# Patient Record
Sex: Male | Born: 1940 | Race: White | Hispanic: No | Marital: Married | Smoking: Former smoker
Health system: Southern US, Community
[De-identification: ages and names within clinical notes are randomized; demographics above are authoritative.]

## PROBLEM LIST (undated history)

## (undated) DIAGNOSIS — G4733 Obstructive sleep apnea (adult) (pediatric): Secondary | ICD-10-CM

## (undated) DIAGNOSIS — F32A Depression, unspecified: Secondary | ICD-10-CM

## (undated) DIAGNOSIS — Z7289 Other problems related to lifestyle: Secondary | ICD-10-CM

## (undated) DIAGNOSIS — Z87448 Personal history of other diseases of urinary system: Secondary | ICD-10-CM

## (undated) DIAGNOSIS — N2889 Other specified disorders of kidney and ureter: Secondary | ICD-10-CM

## (undated) DIAGNOSIS — I5042 Chronic combined systolic (congestive) and diastolic (congestive) heart failure: Principal | ICD-10-CM

## (undated) DIAGNOSIS — F1090 Alcohol use, unspecified, uncomplicated: Secondary | ICD-10-CM

## (undated) DIAGNOSIS — Z8639 Personal history of other endocrine, nutritional and metabolic disease: Secondary | ICD-10-CM

## (undated) DIAGNOSIS — Z9989 Dependence on other enabling machines and devices: Secondary | ICD-10-CM

## (undated) DIAGNOSIS — I4891 Unspecified atrial fibrillation: Secondary | ICD-10-CM

## (undated) DIAGNOSIS — E785 Hyperlipidemia, unspecified: Secondary | ICD-10-CM

## (undated) DIAGNOSIS — I10 Essential (primary) hypertension: Secondary | ICD-10-CM

## (undated) DIAGNOSIS — F329 Major depressive disorder, single episode, unspecified: Secondary | ICD-10-CM

## (undated) DIAGNOSIS — K8020 Calculus of gallbladder without cholecystitis without obstruction: Secondary | ICD-10-CM

## (undated) DIAGNOSIS — Z8719 Personal history of other diseases of the digestive system: Secondary | ICD-10-CM

## (undated) DIAGNOSIS — Z789 Other specified health status: Secondary | ICD-10-CM

## (undated) DIAGNOSIS — I2699 Other pulmonary embolism without acute cor pulmonale: Secondary | ICD-10-CM

## (undated) HISTORY — DX: Calculus of gallbladder without cholecystitis without obstruction: K80.20

## (undated) HISTORY — DX: Morbid (severe) obesity due to excess calories: E66.01

## (undated) HISTORY — DX: Personal history of other diseases of urinary system: Z87.448

## (undated) HISTORY — DX: Major depressive disorder, single episode, unspecified: F32.9

## (undated) HISTORY — DX: Dependence on other enabling machines and devices: Z99.89

## (undated) HISTORY — DX: Other problems related to lifestyle: Z72.89

## (undated) HISTORY — DX: Alcohol use, unspecified, uncomplicated: F10.90

## (undated) HISTORY — DX: Other pulmonary embolism without acute cor pulmonale: I26.99

## (undated) HISTORY — DX: Unspecified atrial fibrillation: I48.91

## (undated) HISTORY — DX: Essential (primary) hypertension: I10

## (undated) HISTORY — DX: Hyperlipidemia, unspecified: E78.5

## (undated) HISTORY — DX: Personal history of other diseases of the digestive system: Z87.19

## (undated) HISTORY — DX: Obstructive sleep apnea (adult) (pediatric): G47.33

## (undated) HISTORY — DX: Chronic combined systolic (congestive) and diastolic (congestive) heart failure: I50.42

## (undated) HISTORY — DX: Other specified disorders of kidney and ureter: N28.89

## (undated) HISTORY — DX: Depression, unspecified: F32.A

## (undated) HISTORY — DX: Other specified health status: Z78.9

## (undated) HISTORY — DX: Personal history of other endocrine, nutritional and metabolic disease: Z86.39

---

## 2005-06-05 ENCOUNTER — Encounter: Admission: RE | Admit: 2005-06-05 | Discharge: 2005-09-03 | Payer: Self-pay | Admitting: Endocrinology

## 2005-09-13 ENCOUNTER — Ambulatory Visit: Payer: Self-pay | Admitting: Internal Medicine

## 2005-09-14 ENCOUNTER — Ambulatory Visit: Payer: Self-pay | Admitting: Internal Medicine

## 2005-12-31 DIAGNOSIS — I2699 Other pulmonary embolism without acute cor pulmonale: Secondary | ICD-10-CM

## 2005-12-31 HISTORY — DX: Other pulmonary embolism without acute cor pulmonale: I26.99

## 2006-06-30 HISTORY — PX: ESOPHAGOGASTRODUODENOSCOPY: SHX1529

## 2006-07-01 ENCOUNTER — Encounter (INDEPENDENT_AMBULATORY_CARE_PROVIDER_SITE_OTHER): Payer: Self-pay | Admitting: Specialist

## 2006-07-01 ENCOUNTER — Ambulatory Visit (HOSPITAL_COMMUNITY): Admission: RE | Admit: 2006-07-01 | Discharge: 2006-07-01 | Payer: Self-pay | Admitting: *Deleted

## 2006-12-31 HISTORY — PX: CARDIAC CATHETERIZATION: SHX172

## 2007-01-14 ENCOUNTER — Ambulatory Visit: Payer: Self-pay | Admitting: Cardiology

## 2007-01-24 ENCOUNTER — Encounter: Payer: Self-pay | Admitting: Cardiology

## 2007-01-24 ENCOUNTER — Ambulatory Visit: Admission: RE | Admit: 2007-01-24 | Discharge: 2007-01-24 | Payer: Self-pay | Admitting: Endocrinology

## 2007-12-02 HISTORY — PX: GASTRIC BYPASS: SHX52

## 2009-02-07 ENCOUNTER — Inpatient Hospital Stay: Payer: Self-pay | Admitting: Internal Medicine

## 2009-05-31 HISTORY — PX: COLONOSCOPY: SHX174

## 2009-05-31 LAB — HM COLONOSCOPY: HM Colonoscopy: 2

## 2009-06-01 ENCOUNTER — Ambulatory Visit: Payer: Self-pay | Admitting: Unknown Physician Specialty

## 2010-08-10 ENCOUNTER — Ambulatory Visit: Payer: Self-pay | Admitting: Internal Medicine

## 2010-08-10 DIAGNOSIS — Z86718 Personal history of other venous thrombosis and embolism: Secondary | ICD-10-CM

## 2010-08-10 DIAGNOSIS — Z9884 Bariatric surgery status: Secondary | ICD-10-CM

## 2010-08-10 DIAGNOSIS — J309 Allergic rhinitis, unspecified: Secondary | ICD-10-CM | POA: Insufficient documentation

## 2010-08-10 DIAGNOSIS — G4733 Obstructive sleep apnea (adult) (pediatric): Secondary | ICD-10-CM

## 2010-08-10 DIAGNOSIS — F331 Major depressive disorder, recurrent, moderate: Secondary | ICD-10-CM | POA: Insufficient documentation

## 2010-08-15 ENCOUNTER — Ambulatory Visit: Payer: Self-pay | Admitting: Family Medicine

## 2010-08-15 LAB — CONVERTED CEMR LAB
Albumin: 4 g/dL (ref 3.5–5.2)
Alkaline Phosphatase: 60 units/L (ref 39–117)
BUN: 15 mg/dL (ref 6–23)
Calcium: 8.9 mg/dL (ref 8.4–10.5)
GFR calc non Af Amer: 130.21 mL/min (ref >60)
Glucose, Bld: 90 mg/dL (ref 70–99)
HDL: 43.3 mg/dL (ref >39.00)
LDL Cholesterol: 115 mg/dL — ABNORMAL HIGH (ref 0–99)
Potassium: 4.5 meq/L (ref 3.5–5.1)
Sodium: 140 meq/L (ref 135–145)
TSH: 1.1 microintl units/mL (ref 0.35–5.50)
Total Bilirubin: 0.8 mg/dL (ref 0.3–1.2)
VLDL: 21.6 mg/dL (ref 0.0–40.0)

## 2010-08-16 ENCOUNTER — Encounter (INDEPENDENT_AMBULATORY_CARE_PROVIDER_SITE_OTHER): Payer: Self-pay | Admitting: *Deleted

## 2010-09-13 ENCOUNTER — Ambulatory Visit: Payer: Self-pay | Admitting: Internal Medicine

## 2010-09-30 DIAGNOSIS — N179 Acute kidney failure, unspecified: Secondary | ICD-10-CM | POA: Insufficient documentation

## 2010-09-30 DIAGNOSIS — Z87448 Personal history of other diseases of urinary system: Secondary | ICD-10-CM

## 2010-09-30 HISTORY — DX: Personal history of other diseases of urinary system: Z87.448

## 2010-09-30 HISTORY — PX: DOPPLER ECHOCARDIOGRAPHY: SHX263

## 2010-10-05 ENCOUNTER — Encounter: Payer: Self-pay | Admitting: Cardiovascular Disease

## 2010-10-06 ENCOUNTER — Inpatient Hospital Stay: Payer: Self-pay | Admitting: Internal Medicine

## 2010-10-06 ENCOUNTER — Ambulatory Visit: Payer: Self-pay | Admitting: Cardiovascular Disease

## 2010-10-06 ENCOUNTER — Encounter: Payer: Self-pay | Admitting: Family Medicine

## 2010-10-07 ENCOUNTER — Encounter: Payer: Self-pay | Admitting: Family Medicine

## 2010-10-08 ENCOUNTER — Encounter: Payer: Self-pay | Admitting: Family Medicine

## 2010-10-10 ENCOUNTER — Telehealth (INDEPENDENT_AMBULATORY_CARE_PROVIDER_SITE_OTHER): Payer: Self-pay | Admitting: *Deleted

## 2010-10-12 ENCOUNTER — Ambulatory Visit: Payer: Self-pay | Admitting: Cardiovascular Disease

## 2010-10-12 DIAGNOSIS — I5042 Chronic combined systolic (congestive) and diastolic (congestive) heart failure: Secondary | ICD-10-CM | POA: Insufficient documentation

## 2010-10-16 LAB — CONVERTED CEMR LAB
Chloride: 97 meq/L (ref 96–112)
Potassium: 4.6 meq/L (ref 3.5–5.3)
Pro B Natriuretic peptide (BNP): 322.6 pg/mL — ABNORMAL HIGH (ref 0.0–100.0)
Sodium: 135 meq/L (ref 135–145)

## 2010-10-19 ENCOUNTER — Ambulatory Visit: Payer: Self-pay | Admitting: Family Medicine

## 2010-10-19 DIAGNOSIS — N179 Acute kidney failure, unspecified: Secondary | ICD-10-CM

## 2010-10-19 DIAGNOSIS — R7309 Other abnormal glucose: Secondary | ICD-10-CM

## 2010-10-20 ENCOUNTER — Telehealth (INDEPENDENT_AMBULATORY_CARE_PROVIDER_SITE_OTHER): Payer: Self-pay | Admitting: *Deleted

## 2010-10-20 LAB — CONVERTED CEMR LAB
CO2: 30 meq/L (ref 19–32)
Chloride: 101 meq/L (ref 96–112)
Glucose, Bld: 98 mg/dL (ref 70–99)
Sodium: 138 meq/L (ref 135–145)

## 2010-10-30 ENCOUNTER — Ambulatory Visit: Payer: Self-pay | Admitting: Cardiovascular Disease

## 2010-10-30 DIAGNOSIS — I4891 Unspecified atrial fibrillation: Secondary | ICD-10-CM

## 2010-10-30 HISTORY — DX: Unspecified atrial fibrillation: I48.91

## 2010-11-03 ENCOUNTER — Ambulatory Visit: Payer: Self-pay | Admitting: Internal Medicine

## 2010-11-03 ENCOUNTER — Encounter: Payer: Self-pay | Admitting: Cardiovascular Disease

## 2010-11-08 ENCOUNTER — Ambulatory Visit: Payer: Self-pay | Admitting: Cardiology

## 2010-11-08 ENCOUNTER — Encounter: Payer: Self-pay | Admitting: Cardiovascular Disease

## 2010-11-10 ENCOUNTER — Telehealth: Payer: Self-pay | Admitting: Cardiovascular Disease

## 2010-11-17 ENCOUNTER — Ambulatory Visit: Payer: Self-pay | Admitting: Cardiovascular Disease

## 2010-11-27 ENCOUNTER — Ambulatory Visit: Payer: Self-pay | Admitting: Family Medicine

## 2010-11-27 DIAGNOSIS — G4700 Insomnia, unspecified: Secondary | ICD-10-CM

## 2010-11-28 LAB — CONVERTED CEMR LAB
CO2: 33 meq/L — ABNORMAL HIGH (ref 19–32)
Chloride: 98 meq/L (ref 96–112)
GFR calc non Af Amer: 75.64 mL/min (ref >60)
Glucose, Bld: 110 mg/dL — ABNORMAL HIGH (ref 70–99)
Potassium: 3.6 meq/L (ref 3.5–5.1)
Sodium: 140 meq/L (ref 135–145)

## 2010-12-11 ENCOUNTER — Telehealth: Payer: Self-pay | Admitting: Family Medicine

## 2010-12-18 ENCOUNTER — Encounter: Payer: Self-pay | Admitting: Cardiovascular Disease

## 2010-12-18 ENCOUNTER — Ambulatory Visit: Payer: Self-pay | Admitting: Cardiovascular Disease

## 2010-12-19 ENCOUNTER — Telehealth: Payer: Self-pay | Admitting: Family Medicine

## 2010-12-20 LAB — CONVERTED CEMR LAB
CO2: 30 meq/L (ref 19–32)
Chloride: 99 meq/L (ref 96–112)
Sodium: 140 meq/L (ref 135–145)

## 2011-01-30 NOTE — Letter (Signed)
Summary: Generic Letter  [place] at [facility]  [address] [place], [place] [address]   Phone: [phone]  Fax: [phone]    08/16/2010  Jordan Mcgrath [address] [place], [place]  [address]  Dear [name],  Overall you bloodwork looks normal. Your LDL (bad cholesterol) is slightly elevated. in order to lower this pay careful attention to your diet and exercise. These two things should help to lower this number.   I have enclosed a copy of your results for your review and records. Please do not hesitate to contact the office should you have any questions or concerns.           Sincerely,      Kim Dance CMA (AAMA) for [name], MD

## 2011-01-30 NOTE — Progress Notes (Signed)
Summary: SOB  Phone Note Call from Patient Call back at Home Phone [phone]   Summary of Call: Returned call from patient who states he was discharged from the hospital recently for CHF.  He was evaluated by Dr. Mariah Milling in the hospital where he diuresed approximately 25 pounds.  He states he is more short of breath today.  He has been weighing daily and he continues to have negative numbers.  Pt states he was down 5 pounds today.  He does not notice increase swelling just mild SOB, this has not increased throughout the day.  Pt denies any orthopnea, PND, or chest pain.  Pt was calling to see if he should take his metformin as usual.  I have informed him that this was ok to continue tonight.  He was also suppose to hear from the [facility] for followup appt, this has yet to happen.  I have advised the patient to call the office in the morning to ensure follow up with our office.  I have also asked the patient to present to the ER if he has increased SOB or acute SOB.  Pt voices understanding.  He appreciated the call back.  Initial call taken by: Robbi Garter NP-PA,  October 10, 2010 11:18 PM     Appended Document: SOB pt has appt 10/13 with Dr. Mariah Milling

## 2011-01-30 NOTE — Progress Notes (Signed)
Summary: EXPECTING CALL  Phone Note Call from Patient Call back at Home Phone [phone]   Caller: SELF Call For: [facility] Summary of Call: PT WAS EXPECTING A CALL REGARDING THE EKG THAT WAS PERFORMED THE OTHER DAY-IF PT IS TO CONTINUE RX, HE WILL NEED IT CALLED IN-TORSEMIDE 20 MG TO MEDICAP Initial call taken by: Harlon Flor,  November 10, 2010 10:48 AM    Prescriptions: TORSEMIDE 20 MG TABS (TORSEMIDE) 2 tablets in am 1 in pm for heart  #90 x 1   Entered by:   Bishop Dublin, CMA   Authorized by:   Dossie Arbour MD   Signed by:   Bishop Dublin, CMA on 11/10/2010   Method used:   Electronically to        [facility] [phone]* (retail)       [address] [place], [place]  [address]       Ph: [phone]       Fax: [phone]   RxID:   [id number]

## 2011-01-30 NOTE — Assessment & Plan Note (Signed)
Summary: ekg/alt  Nurse Visit   Vital Signs:  Patient profile:   70 year old male Height:      70.5 inches Weight:      270 pounds BMI:     38.33 Pulse rate:   65 / minute BP sitting:   90 / 64  (left arm) Cuff size:   large  Vitals Entered By: Bishop Dublin, CMA (November 08, 2010 9:01 AM)  Past History:  Past Medical History: Last updated: 10/19/2010 CHF ARF (09/2010) OSA - CPAP (auto, on 7 usually) Morbid obesity s/p gastric bypass 2008 Allergic rhinitis Depression (verified on Effexor XR two times a day) h/o PE x 1 (2007) completed coumadin course  h/o GERD, DM, HTN, HLD, improved after bypass asthma as child hospitalization 09/2010 - CHF, ARF  Past Surgical History: Last updated: 10/17/2010 cath [facility] (12/2006) - normal LVEF 55%, no regional wall motion abnl.  No obstructive CAD noted.  valves WNL (Dr. 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 Lives with wife.  1 daughter, 1 son. 3 dogs - boston terrier, [place]foundland, [place] [employer] educated  Risk Factors: Alcohol Use: 2 (08/10/2010) Caffeine Use: 1-2 cup (08/10/2010)  Risk Factors: Smoking Status: quit > 6 months (08/10/2010)   Current Medications (verified): 1)  Aspirin  81 Mg Tbec (Aspirin) .... One Daily 2)  Metoprolol Tartrate 25 Mg Tabs (Metoprolol Tartrate) .... Take 1 Tablet By Mouth Two Times A Day 3)  Magnesium Oxide 400 Mg Tabs (Magnesium Oxide) .... One Daily 4)  Torsemide 20 Mg Tabs (Torsemide) .... 2 Tablets in Am 1 in Pm For Heart 5)  Nasonex 50 Mcg/act Susp (Mometasone Furoate) .... 2 Puffs Each Nostril Two Times A Day 6)  Lunesta 3 Mg Tabs (Eszopiclone) .Marland Kitchen.. 1 By Mouth At Bedtime 7)  Effexor Xr 150 Mg Xr24h-Cap (Venlafaxine Hcl) .Marland Kitchen.. 1 By Mouth Two Times A Day 8)  Fish Oil 1000 Mg Caps (Omega-3 Fatty Acids) .Marland Kitchen.. 1 By Mouth Two Times A Day 9)  Calcium 1200-1000 Mg-Unit Chew (Calcium Carbonate-Vit D-Min) .Marland Kitchen.. 1 Once Daily 10)  Mens Multivitamin Plus  Tabs (Multiple Vitamins-Minerals) .Marland Kitchen.. 1 Two Times A Day 11)  B-12 Dots 500 Mcg Subl (Cyanocobalamin) .Marland Kitchen.. 1 Every Morning Under Tongue 12)  Iron 325 (65 Fe) Mg Tabs (Ferrous Sulfate) .Marland Kitchen.. 1 Once Daily 2 Hours Before or After Calcium 13)  Qc Stool Softener 100 Mg Caps (Docusate Sodium) .... One By Mouth Two Times A Day 14)  Thiamine Hcl 100 Mg Tabs (Thiamine Hcl) .... One Daily 15)  Pradaxa 150 Mg Caps (Dabigatran Etexilate Mesylate) .... Take 1 Tablet By Mouth Two Times A Day 16)  Amiodarone Hcl 200 Mg Tabs (Amiodarone Hcl) .... Take One Tablet By Mouth Twice A Day 17)  Digoxin 0.25 Mg Tabs (  Digoxin) .... Take One Tablet By Mouth Daily  Allergies (verified): 1)  ! Sulfa  Visit Type:  Nurse visit  CC:  EKG.

## 2011-01-30 NOTE — Miscellaneous (Signed)
Summary: new pt input from psych Dr. 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) Observations: Added new observation of PAST MED HX: Morbid obesity s/p gastric bypass 2008 Allergic rhinitis Depression (verified on Effexor XR two times a day) OSA - CPAP (auto, on 7 usually) h/o PE x 1 (2007) completed coumadin course  h/o GERD, DM, HTN, HLD, improved after bypass asthma as child (08/15/2010 8:26)       Past History:  Past Medical History: Morbid obesity s/p gastric bypass 2008 Allergic rhinitis Depression (verified on Effexor XR two times a day) OSA - CPAP (auto, on 7 usually) h/o PE x 1 (2007) completed coumadin course  h/o GERD, DM, HTN, HLD, improved after bypass asthma as child  Appended Document: new pt input from PCP [place]    Clinical Lists Changes  Allergies: Changed allergy or adverse reaction from SULFA to SULFA Observations: Added new observation of PAST SURG HX: cath [facility] (12/2006) - normal LVEF 55%, no regional wall motion abnl.  No obstructive CAD noted.  valves WNL s/p gastric bypass (Roux en Y) 2008 CXR 2010 - COPD, stable CM  Colonoscopy 2 polyps, sm int hemmorhoids ([facility] [name]), rpt 5 years (05/2009) EGD (GERD) path - benign gastric mucosa (06/2006) (08/25/2010 9:28) Added new observation of ALLERGY REV: Done (08/25/2010 9:28) Added new observation of CALCIUM: 8.8 mg/dL (16/10/9603 5:40) Added new observation of ALBUMIN: 3.9 g/dL (98/10/9146 8:29) Added new observation of PROTEIN, TOT: 6.4 g/dL (56/21/3086 5:78) Added new observation of SGPT (ALT): 34 units/L (03/22/2009 9:28) Added new observation of SGOT (AST): 31 units/L (03/22/2009 9:28) Added new observation of ALK PHOS: 74 units/L (03/22/2009 9:28) Added new observation of CREATININE: 0.66 mg/dL (46/96/2952 8:41) Added new observation of BUN: 13 mg/dL (32/44/0102 7:25) Added new  observation of BG RANDOM: 103 mg/dL (36/64/4034 7:42) Added new observation of K SERUM: 4.7 meq/L (03/22/2009 9:28) Added new observation of NA: 138 meq/L (03/22/2009 9:28) Added new observation of BNP PG/ML: 117.8 pg/mL (02/24/2009 9:28) Added new observation of D_DIMER FEU: 0.3 ug/mL FEU (01/12/2009 9:28) Added new observation of PLATELETK/UL: 132 K/uL (01/12/2009 9:28) Added new observation of HCT: 42.9 % (01/12/2009 9:28) Added new observation of HGB: 14.7 g/dL (59/56/3875 6:43) Added new observation of RBC M/UL: 4.51 M/uL (01/12/2009 9:28) Added new observation of WBC COUNT: 5.1 10*3/microliter (01/12/2009 9:28) Added new observation of TSH: 1.580 microintl units/mL (01/12/2009 9:28) Added new observation of T4, TOTAL: 4.3 mcg/dL (32/95/1884 1:66) Added new observation of PSA: 0.3 ng/mL (01/12/2009 9:28) Added new observation of LDL: 76 mg/dL (06/29/1600 0:93) Added new observation of HDL: 42 mg/dL (23/55/7322 0:25) Added new observation of TRIGLYC TOT: 98 mg/dL (42/70/6237 6:28) Added new observation of CHOLESTEROL: 138 mg/dL (31/51/7616 0:73)        Allergies: 1)  ! Sulfa   Past History:  Past Surgical History: cath [facility] (12/2006) - normal LVEF 55%, no regional wall motion abnl.  No obstructive CAD noted.  valves WNL s/p gastric bypass (Roux en Y) 2008 CXR 2010 - COPD, stable CM  Colonoscopy 2 polyps, sm int hemmorhoids ([facility] [name]), rpt 5 years (05/2009) EGD (GERD) path - benign gastric mucosa (06/2006)

## 2011-01-30 NOTE — Letter (Signed)
Summary: [facility] - CHF exac, renal insufficiency  [facility]   Imported By: Lanelle Bal 10/17/2010 11:38:53  _____________________________________________________________________  External Attachment:    Type:   Image     Comment:   External Document  Appended Document: D/C summary med changes    Clinical Lists Changes  Medications: Added new medication of ASPIRIN 81 MG TBEC (ASPIRIN) one daily Added new medication of METOPROLOL TARTRATE 25 MG TABS (METOPROLOL TARTRATE) 1/2 tab two times a day Added new medication of MAGNESIUM OXIDE 400 MG TABS (MAGNESIUM OXIDE) one daily Added new medication of KLOR-CON 20 MEQ PACK (POTASSIUM CHLORIDE) two daily Changed medication from FUROSEMIDE 40 MG TABS (FUROSEMIDE) 1-2 by mouth once daily as needed (if increase in weight by 2 lbs take 2. If no increase take 1) to TORSEMIDE 20 MG TABS (TORSEMIDE) 2 tablets two times a day for heart Added new medication of THIAMINE HCL 100 MG TABS (THIAMINE HCL) one daily Observations: Added new observation of PAST MED HX: Morbid obesity s/p gastric bypass 2008 Allergic rhinitis Depression (verified on Effexor XR two times a day) OSA - CPAP (auto, on 7 usually) h/o PE x 1 (2007) completed coumadin course hospitalization 09/2010 - CHF, ARF  h/o GERD, DM, HTN, HLD, improved after bypass asthma as child (10/17/2010 23:27) Added new observation of PAST SURG HX: cath [facility] (12/2006) - normal LVEF 55%, no regional wall motion abnl.  No obstructive CAD noted.  valves WNL (Dr. 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), rpt 5 years (05/2009) EGD (GERD) path - benign gastric mucosa (06/2006) 2D Echo - dilated LV, EF 45%, diastolic dysfunction, mod-severe MR, mod pHTN (09/2010) (10/17/2010 23:27) Added new observation of FLUVAXDUE: 09/01/2011 (10/17/2010 23:27) Added new observation of  PNEUVAXDECLN: Not indicated (10/17/2010 23:27) Added new observation of DM PROGRESS: N/A (10/17/2010 23:27) Added new observation of DM FSREVIEW: N/A (10/17/2010 23:27) Added new observation of HTN PROGRESS: N/A (10/17/2010 23:27) Added new observation of HTN FSREVIEW: N/A (10/17/2010 23:27) Added new observation of LIPID PROGRS: N/A (10/17/2010 23:27) Added new observation of LIPID FSREVW: N/A (10/17/2010 23:27) Added new observation of PNEUMOVAX: given (10/08/2010 23:32) Added new observation of FLU VAX: given (10/08/2010 23:32)        Past History:  Past Medical History: Morbid obesity s/p gastric bypass 2008 Allergic rhinitis Depression (verified on Effexor XR two times a day) OSA - CPAP (auto, on 7 usually) h/o PE x 1 (2007) completed coumadin course hospitalization 09/2010 - CHF, ARF  h/o GERD, DM, HTN, HLD, improved after bypass asthma as child  Past Surgical History: cath [facility] (12/2006) - normal LVEF 55%, no regional wall motion abnl.  No obstructive CAD noted.  valves WNL (Dr. 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), rpt 5 years (05/2009) EGD (GERD) path - benign gastric mucosa (06/2006) 2D Echo - dilated LV, EF 45%, diastolic dysfunction, mod-severe MR, mod pHTN (09/2010)   -  Date:  10/08/2010    Flu vaccine given    Pneumovax given    Prevention & Chronic Care Immunizations   Influenza vaccine: given  (10/08/2010)   Influenza vaccine deferral: Refused  (09/13/2010)   Influenza vaccine due: 09/01/2011    Tetanus booster: Not documented    Pneumococcal vaccine: given  (10/08/2010)   Pneumococcal vaccine deferral: Not  indicated  (10/17/2010)    H. zoster vaccine: 09/13/2010: Zostavax   H. zoster vaccine deferral: Not indicated  (09/13/2010)  Colorectal Screening   Hemoccult: Not documented   Hemoccult action/deferral: Not indicated   (09/13/2010)    Colonoscopy: Not documented  Other Screening   PSA: 0.75  (08/15/2010)   PSA due due: 08/16/2011   Smoking status: quit > 6 months  (08/10/2010)  Lipids   Total Cholesterol: 180  (08/15/2010)   LDL: 115  (08/15/2010)   LDL Direct: Not documented   HDL: 43.30  (08/15/2010)   Triglycerides: 108.0  (08/15/2010)

## 2011-01-30 NOTE — Assessment & Plan Note (Signed)
Summary: 1 MONTH FOLLOW UP/[facility]   Vital Signs:  Patient profile:   70 year old male Weight:      294.50 pounds Temp:     98.0 degrees F oral Pulse rate:   88 / minute Pulse rhythm:   regular BP sitting:   110 / 70  (left arm) Cuff size:   large  Vitals Entered By: Selena Batten Dance CMA (AAMA) (September 13, 2010 8:00 AM) CC: 1 month follow up   History of Present Illness: CC: 93mo f/u  Weight - stable.  walking daily 2-4 miles/day with dogs.  No SOB, CP with walking.  staying busy at home - renovated room.  s/p gastric bypass.  Received records from Dr. Chauncey Mann surgery, reviewed.  Blood work - reviewed.  Requests zostavax - states insurance will cover.  Still waiting records from Dr. Juleen China, previous PCP, regarding tetanus and pneumovax.  Will receive flu at [facility].  Adjusting to retired life.  Current Medications (verified): 1)  Nasonex 50 Mcg/act Susp (Mometasone Furoate) .... 2 Puffs Each Nostril Two Times A Day 2)  Furosemide 40 Mg Tabs (Furosemide) .Marland Kitchen.. 1-2 By Mouth Once Daily As Needed (If Increase in Weight By 2 Lbs Take 2. If No Increase Take 1) 3)  Lunesta 3 Mg Tabs (Eszopiclone) .Marland Kitchen.. 1 By Mouth At Bedtime 4)  Effexor Xr 150 Mg Xr24h-Cap (Venlafaxine Hcl) .Marland Kitchen.. 1 By Mouth Two Times A Day 5)  Fish Oil 1000 Mg Caps (Omega-3 Fatty Acids) .Marland Kitchen.. 1 By Mouth Two Times A Day 6)  Calcium 1200-1000 Mg-Unit Chew (Calcium Carbonate-Vit D-Min) .Marland Kitchen.. 1 Once Daily 7)  Mens Multivitamin Plus  Tabs (Multiple Vitamins-Minerals) .Marland Kitchen.. 1 Two Times A Day 8)  B-12 Dots 500 Mcg Subl (Cyanocobalamin) .Marland Kitchen.. 1 Every Morning Under Tongue 9)  Iron 325 (65 Fe) Mg Tabs (Ferrous Sulfate) .Marland Kitchen.. 1 Once Daily 2 Hours Before or After Calcium 10)  Qc Stool Softener 100 Mg Caps (Docusate Sodium) .... One By Mouth Two Times A Day  Allergies: 1)  ! 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 Lives with wife.  1 daughter, 1 son. 3 dogs - boston terrier, [place], [employer] educated  Review of Systems       per HPI  Physical Exam  General:  Well-developed,well-nourished,in no acute distress; alert,appropriate and cooperative throughout examination.  obese Lungs:  Normal respiratory effort, chest expands symmetrically. Lungs are clear to auscultation, no crackles or wheezes. Heart:  Normal rate and regular rhythm. S1 and S2 normal without gallop, murmur, click, rub or other extra sounds. tachycardic Pulses:  2+ rad pulses Extremities:  No clubbing, cyanosis, edema, or deformity noted with normal full range of motion of all joints.     Impression & Recommendations:  Problem # 1:  MORBID OBESITY (ICD-278.01)  f/u 6 mo, sooner if needed.  weight stable.  encouraged continued exercise, staying active, watching diet.  Ht: 70.5 (08/10/2010)   Wt: 294.50 (09/13/2010)   BMI: 41.95 (08/10/2010)  Problem # 2:  BARIATRIC SURGERY STATUS (ICD-V45.86) continue to monitor.  will input records from [facility].  Problem # 3:  Preventive Health Care (ICD-V70.0) still awaiting records from Dr. Juleen China re last pneumovax and tetanus.  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  Complete Medication  List: 1)  Nasonex 50 Mcg/act Susp (Mometasone furoate) .... 2 puffs each nostril two times a day 2)  Furosemide 40 Mg Tabs (Furosemide) .Marland Kitchen.. 1-2 by mouth once daily as needed (if increase in weight by 2 lbs take 2. if no increase take 1) 3)  Lunesta 3 Mg Tabs (Eszopiclone) .Marland Kitchen.. 1 by mouth at bedtime 4)  Effexor Xr 150 Mg Xr24h-cap (Venlafaxine hcl) .Marland Kitchen.. 1 by mouth two times a day 5)  Fish Oil 1000 Mg Caps (Omega-3 fatty acids) .Marland Kitchen.. 1 by mouth two times a day 6)  Calcium 1200-1000 Mg-unit Chew (Calcium carbonate-vit d-min) .Marland Kitchen.. 1 once daily 7)  Mens  Multivitamin Plus Tabs (Multiple vitamins-minerals) .Marland Kitchen.. 1 two times a day 8)  B-12 Dots 500 Mcg Subl (Cyanocobalamin) .Marland Kitchen.. 1 every morning under tongue 9)  Iron 325 (65 Fe) Mg Tabs (Ferrous sulfate) .Marland Kitchen.. 1 once daily 2 hours before or after calcium 10)  Qc Stool Softener 100 Mg Caps (Docusate sodium) .... One by mouth two times a day  Other Orders: Zoster (Shingles) Vaccine Live [phone]) Admin 1st Vaccine ([id number])  Patient Instructions: 1)  Please return in 6 months or as needed. 2)  Zostavax today. 3)  Pleasure to see you today.  Call clinic with questions.  Current Allergies (reviewed today): ! SULFA   Prevention & Chronic Care Immunizations   Influenza vaccine: Not documented   Influenza vaccine deferral: Refused  (09/13/2010)    Tetanus booster: Not documented    Pneumococcal vaccine: Not documented    H. zoster vaccine: 09/13/2010: Zostavax   H. zoster vaccine deferral: Not indicated  (09/13/2010)  Colorectal Screening   Hemoccult: Not documented   Hemoccult action/deferral: Not indicated  (09/13/2010)    Colonoscopy: Not documented  Other Screening   PSA: 0.75  (08/15/2010)   PSA due due: 08/16/2011   Smoking status: quit > 6 months  (08/10/2010)  Lipids   Total Cholesterol: 180  (08/15/2010)   LDL: 115  (08/15/2010)   LDL Direct: Not documented   HDL: 43.30  (08/15/2010)   Triglycerides: 108.0  (08/15/2010)    Immunizations Administered:  Zostavax # 1:    Vaccine Type: Zostavax    Site: right deltoid    Mfr: Merck    Dose: 0.65    Route: [place]    Given by: Selena Batten Dance CMA (AAMA)    Exp. Date: 07/26/2011    Lot #: [id number]    VIS given: 10/12/05 given September 13, 2010.  Appended Document: record input [facility] Dr. Lily Peer received records and input into flowsheet. [name]  MD  September 14, 2010 12:33 PM    Clinical Lists Changes  Observations: Added new observation of PAST SURG HX: cath [facility] (12/2006) - normal LVEF 55%, no regional wall  motion abnl.  No obstructive CAD noted.  valves WNL (Dr. 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), rpt 5 years (05/2009) EGD (GERD) path - benign gastric mucosa (06/2006) (09/14/2010 12:24) Added new observation of CALCIUM: 9.4 mg/dL (56/21/3086 57:84) Added new observation of ALBUMIN: 3.8 g/dL (69/62/9528 41:32) Added new observation of PROTEIN, TOT: 6.3 g/dL (44/12/270 53:66) Added new observation of SGPT (ALT): 41 units/L (06/09/2010 12:24) Added new observation of SGOT (AST): 37 units/L (06/09/2010 12:24) Added new observation of ALK PHOS: 69 units/L (06/09/2010 12:24) Added new observation of BILI TOTAL: 0.7 mg/dL (44/02/4741 59:56) Added new observation of CREATININE: 0.8 mg/dL (38/75/6433 29:51)  Added new observation of BUN: 9 mg/dL (13/07/6577 46:96) Added new observation of BG RANDOM: 97 mg/dL (29/52/8413 24:40) Added new observation of ANION GAP: 5  (06/09/2010 12:24) Added new observation of CO2 PLSM/SER: 29 meq/L (06/09/2010 12:24) Added new observation of CL SERUM: 106 meq/L (06/09/2010 12:24) Added new observation of K SERUM: 4.5 meq/L (06/09/2010 12:24) Added new observation of NA: 140 meq/L (06/09/2010 12:24) Added new observation of PLATELETK/UL: 136 K/uL (05/20/2009 12:24) Added new observation of MCV: 96.4 fL (05/20/2009 12:24) Added new observation of HCT: 43.9 % (05/20/2009 12:24) Added new observation of HGB: 15.4 g/dL (10/27/2535 64:40) Added new observation of RBC M/UL: 4.56 M/uL (05/20/2009 12:24) Added new observation of WBC COUNT: 4.9 10*3/microliter (05/20/2009 12:24) Added new observation of HGBA1C: 5.1 % (05/20/2009 12:24) Added new observation of FERRITIN: 77 ng/mL (05/20/2009 12:24) Added new observation of IRON SATUR %: 32 % (05/20/2009 12:24) Added new observation of TIBC: 376 mcg/dL (34/74/2595 63:87) Added new observation of IRON: 122 mcg/dL  (56/43/3295 18:84) Added new observation of VIT D 25-OH: 33.8 ng/mL (05/20/2009 12:24) Added new observation of FOLATE: >20.0 ng/mL (05/20/2009 12:24) Added new observation of TSH: 1.009 microintl units/mL (05/20/2009 12:24) Added new observation of PTH, INTACT: 66 pg/mL (05/20/2009 12:24) Added new observation of B12: 917 pg/mL (11/18/2008 12:24) Added new observation of LDL: 91 mg/dL (16/60/6301 60:10) Added new observation of HDL: 31 mg/dL (93/23/5573 22:02) Added new observation of TRIGLYC TOT: 74 mg/dL (54/27/0623 76:28) Added new observation of CHOLESTEROL: 137 mg/dL (31/51/7616 07:37) Added new observation of URIC ACID: 4.9 mg/dL (10/62/6948 54:62) Vitamin B1: 126 NMOL/L  (05/20/2009)         Past History:  Past Surgical History: cath [facility] (12/2006) - normal LVEF 55%, no regional wall motion abnl.  No obstructive CAD noted.  valves WNL (Dr. 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), rpt 5 years (05/2009) EGD (GERD) path - benign gastric mucosa (06/2006)

## 2011-01-30 NOTE — Progress Notes (Signed)
----   Converted from flag ---- ---- 10/20/2010 10:09 AM, [name]  MD wrote: dx 428.0  ---- 10/20/2010 8:55 AM, Mills Koller wrote: I can add a MG, What DX? T  ---- 10/19/2010 6:13 PM, [name]  MD wrote:  can we add Mg and A1c to blood drawn yesterday? ------------------------------

## 2011-01-30 NOTE — Assessment & Plan Note (Signed)
Summary: ROA FOR FOLLOW-UP/JRR   Vital Signs:  Patient profile:   70 year old male Weight:      270.50 pounds Temp:     97.9 degrees F oral Pulse rate:   72 / minute Pulse rhythm:   regular BP sitting:   136 / 78  (left arm) Cuff size:   large  Vitals Entered By: Selena Batten Dance CMA Duncan Dull) (November 27, 2010 8:18 AM) CC: Follow up   History of Present Illness: CC: f/u issues  69yo with h/o gastric bypass 2008, CHF, HTN, diet controlled DM, and OSA presenting for f/u.  1. insomnia - lunesta works well for sleep but after taste driving him crazy.  Has been on lunesta x 6 mo, using nightly, if abstains, trouble falling asleep and staying asleep.  hasn't tried anything else prior (no ambien, tylenol PM, unisom, etc).  Goes to bed prior to 10pm.  No TV at bedtime.  seems to have good sleep hygiene.  2. CHF/afib - doing well on amiodarone. Started on Pradaxa.  Initially had nosebleed (not uncommon for him).  No SOB, CP/tightness, palpitations, dizziness.  Checks BP and HR 3x/day.  Checks weight qam, stable at 268.  3. depression - last saw Dr. Nolen Mu 6 mo ago.  was to see her earlier this month but had scheduling conflict.  feels mood is well controlled in current effexor XR two times a day.  4. HTN - actually bp up a bit for him, states normally sbp runs in 110s.  Current Medications (verified): 1)  Aspirin 81 Mg Tbec (Aspirin) .... One Daily 2)  Metoprolol Tartrate 25 Mg Tabs (Metoprolol Tartrate) .... Take 1 Tablet By Mouth Two Times A Day 3)  Torsemide 20 Mg Tabs (Torsemide) .... 2 Tablets in Am 1 in Pm For Heart 4)  Nasonex 50 Mcg/act Susp (Mometasone Furoate) .... 2 Puffs Each Nostril Two Times A Day 5)  Ambien 5 Mg Tabs (Zolpidem Tartrate) .... Take One Nightly For Sleep 6)  Effexor Xr 150 Mg Xr24h-Cap (Venlafaxine Hcl) .Marland Kitchen.. 1 By Mouth Two Times A Day 7)  Fish Oil 1000 Mg Caps (Omega-3 Fatty Acids) .Marland Kitchen.. 1 By Mouth Two Times A Day 8)  Calcium 1200-1000 Mg-Unit Chew (Calcium  Carbonate-Vit D-Min) .Marland Kitchen.. 1 Once Daily 9)  Mens Multivitamin Plus  Tabs (Multiple Vitamins-Minerals) .Marland Kitchen.. 1 Two Times A Day 10)  B-12 Dots 500 Mcg Subl (Cyanocobalamin) .Marland Kitchen.. 1 Every Morning Under Tongue 11)  Iron 325 (65 Fe) Mg Tabs (Ferrous Sulfate) .Marland Kitchen.. 1 Once Daily 2 Hours Before or After Calcium 12)  Qc Stool Softener 100 Mg Caps (Docusate Sodium) .... One By Mouth Two Times A Day 13)  Pradaxa 150 Mg Caps (Dabigatran Etexilate Mesylate) .... Take 1 Tablet By Mouth Two Times A Day 14)  Amiodarone Hcl 200 Mg Tabs (Amiodarone Hcl) .... Take One Tablet Once Daily 15)  Digoxin 0.25 Mg Tabs (Digoxin) .... Take One Tablet By Mouth Daily  Allergies: 1)  ! Sulfa  Past History:  Past Surgical History: Last updated: 10/17/2010 cath [facility] (12/2006) - normal LVEF 55%, no regional wall motion abnl.  No obstructive CAD noted.  valves WNL (Dr. 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 Lives with wife.  1 daughter, 1 son. 3 dogs - boston terrier, newfoundland, Government social research officer educated  Past Medical History: CHF systolic (EF 45%) and diastolic, mod-severe TR ARF (09/2010) OSA - CPAP (auto, on 7 usually) Morbid obesity s/p gastric bypass 2008 Allergic rhinitis Depression (verified on Effexor XR two times a day) h/o PE x 1 (2007) completed coumadin course  h/o GERD, DM, HTN, HLD, improved after bypass asthma as child hospitalization 09/2010 - CHF, ARF  Review of Systems        per HPI  Physical Exam  General:  Well-developed,well-nourished,in no acute distress; alert,appropriate and cooperative throughout examination.  obese. Head:  Normocephalic and atraumatic without obvious abnormalities. No apparent alopecia or balding. Eyes:  PERRLA, EOMI, + conjunctival injection Mouth:  Oral mucosa and oropharynx without lesions or exudates.  Teeth in good repair.  + cobblestoning pharynx and PND Neck:  No deformities, masses, or tenderness noted.  no bruits Lungs:  Normal respiratory effort, chest expands symmetrically. Lungs are clear to auscultation, no crackles or wheezes. Heart:  regular, normal S1, S2.  no m/r/g Pulses:  2+ rad pulses Extremities:  no pedal edema Skin:  Intact without suspicious lesions or rashes Psych:  blunted affect.  somewhat overwhelmed with recent health issues.   Impression & Recommendations:  Problem # 1:  CHF (ICD-428.0) has been on torsemide >63mo now.  check Cr to ensure tolerating.  discussed low salt diet which patient follows.  discussed daily weight checking, to call us if SOB, CP, leg swelling, fatigue, or noting weight going up.  continue current meds.  continue metoprolol 25 BID  His updated medication list for this problem includes:    Aspirin 81 Mg Tbec (Aspirin) ..... One daily    Metoprolol Tartrate 25 Mg Tabs (Metoprolol tartrate) .Marland Kitchen... Take 1 tablet by mouth two times a day    Torsemide 20 Mg Tabs (Torsemide) .Marland Kitchen... 2 tablets in am 1 in pm for heart    Digoxin 0.25 Mg Tabs (Digoxin) .Marland Kitchen... Take one tablet by mouth daily  Orders: TLB-BMP (Basic Metabolic Panel-BMET) (80048-METABOL)  Problem # 2:  ATRIAL FIBRILLATION (ICD-427.31) NSR today on amiodarone, digoxin, metoprolol.  on pradaxa.  CHADS2 score = 2, 3 if include h/o DM.  also on ASA  His updated medication list for this problem includes:    Aspirin 81 Mg Tbec (Aspirin) ..... One daily    Metoprolol Tartrate 25 Mg Tabs (Metoprolol tartrate) .Marland Kitchen... Take 1 tablet by  mouth two times a day    Amiodarone Hcl 200 Mg Tabs (Amiodarone hcl) .Marland Kitchen... Take one tablet once daily    Digoxin 0.25 Mg Tabs (Digoxin) .Marland Kitchen... Take one tablet by mouth daily  Problem # 3:  HYPERGLYCEMIA (ICD-790.29)  check A1c next blood draw.  Labs Reviewed: Creat: 1.1 (10/19/2010)     Problem # 4:  RENAL FAILURE, ACUTE (ICD-584.9)  resolved.  Cr 1.0 on torsemide.  continue.  K 3.6.    Labs Reviewed: BUN: 26 (10/19/2010)   Cr: 1.1 (10/19/2010)    Hgb: 15.4 (05/20/2009)   Hct: 43.9 (05/20/2009)   Ca++: 9.7 (10/19/2010)    TP: 6.4 (08/15/2010)   Alb: 4.0 (08/15/2010)  Problem # 5:  DEPRESSION, MAJOR, RECURRENT, MODERATE (ICD-296.32) cotninue effexor, await f/u with psych.   Problem # 6:  INSOMNIA (  ICD-780.52) did not tolerate lunesta 2/2 aftertaste.  trial of ambien.  Discussed sleep hygiene.   His updated medication list for this problem includes:    Ambien 5 Mg Tabs (Zolpidem tartrate) .Marland Kitchen... Take one nightly for sleep  Complete Medication List: 1)  Aspirin 81 Mg Tbec (Aspirin) .... One daily 2)  Metoprolol Tartrate 25 Mg Tabs (Metoprolol tartrate) .... Take 1 tablet by mouth two times a day 3)  Torsemide 20 Mg Tabs (Torsemide) .... 2 tablets in am 1 in pm for heart 4)  Nasonex 50 Mcg/act Susp (Mometasone furoate) .... 2 puffs each nostril two times a day 5)  Ambien 5 Mg Tabs (Zolpidem tartrate) .... Take one nightly for sleep 6)  Effexor Xr 150 Mg Xr24h-cap (Venlafaxine hcl) .Marland Kitchen.. 1 by mouth two times a day 7)  Fish Oil 1000 Mg Caps (Omega-3 fatty acids) .Marland Kitchen.. 1 by mouth two times a day 8)  Calcium 1200-1000 Mg-unit Chew (Calcium carbonate-vit d-min) .Marland Kitchen.. 1 once daily 9)  Mens Multivitamin Plus Tabs (Multiple vitamins-minerals) .Marland Kitchen.. 1 two times a day 10)  B-12 Dots 500 Mcg Subl (Cyanocobalamin) .Marland Kitchen.. 1 every morning under tongue 11)  Iron 325 (65 Fe) Mg Tabs (Ferrous sulfate) .Marland Kitchen.. 1 once daily 2 hours before or after calcium 12)  Qc Stool Softener 100 Mg Caps (Docusate sodium)  .... One by mouth two times a day 13)  Pradaxa 150 Mg Caps (Dabigatran etexilate mesylate) .... Take 1 tablet by mouth two times a day 14)  Amiodarone Hcl 200 Mg Tabs (Amiodarone hcl) .... Take one tablet once daily 15)  Digoxin 0.25 Mg Tabs (Digoxin) .... Take one tablet by mouth daily  Patient Instructions: 1)  Return in 3 months for follow up. 2)  Return in next few months for complete physical. 3)  Trial of ambien at night for sleep.   4)  BMP today (blood work) to check kidneys. 5)  Call clinic with questions.  Good to see you today. Prescriptions: AMBIEN 5 MG TABS (ZOLPIDEM TARTRATE) take one nightly for sleep  #30 x 1   Entered and Authorized by:   [name]  MD   Signed by:   [name]  MD on 11/27/2010   Method used:   Print then Give to Patient   RxID:   [id number]    Orders Added: 1)  TLB-BMP (Basic Metabolic Panel-BMET) [80048-METABOL] 2)  Est. Patient Level IV [14782]    Current Allergies (reviewed today): ! SULFA

## 2011-01-30 NOTE — Letter (Signed)
Summary: [facility]  [facility]   Imported By: Beau Fanny 10/19/2010 13:19:50  _____________________________________________________________________  External Attachment:    Type:   Image     Comment:   External Document

## 2011-01-30 NOTE — Assessment & Plan Note (Signed)
Summary: F2W/AMD   Visit Type:  Follow-up Primary Provider:  [name]  MD  CC:  Denies irregular heartbeat & chest pain. Feeling better.Marland Kitchen  History of Present Illness: [name] is a 70 no gentleman, with a hx of  gastric bypass in 2008, CHF, renal insufficiency, obstructive sleep apnea, diabetes that is diet controlled, hypertension. echocardiogram showed moderate pulmonary hypertension, moderate to severe mitral regurgitation, ejection fraction 45%. On telemetry, he had runs of SVT versus short runs of atrial fibrillation that had resolved. History of PE in 2006. He had aggressive diuresis.  he presented to the office at the end of October with atrial fibrillation and RVR with rate of 141 beats per minute. He was started on amiodarone loading and over the next week or so, converted to normal sinus rhythm as confirmed by EKG on November 9.  Since that time he has felt well. We have weaned his amiodarone down to 200 mg daily and he has no complaints. No lightheadedness, no dizziness, no tachycardia.   echocardiogram from October 7 of this year shows ejection fraction 45%, mildly dilated LV, diastolic dysfunction, mildly dilated RV, moderate to severe MR, moderately elevated right ventricular systolic pressures estimated at 50 mm of mercury.  he reports having a cardiac catheterization prior to his gastric bypass in 2008 showed no significant coronary artery disease. This was [facility]  EKG showsnormal sinus rhythm with rate 56 beats per minute, intraventricular conduction delay, left axis deviation/left anterior fascicular block  Current Medications (verified): 1)  Aspirin 81 Mg Tbec (Aspirin) .... One Daily 2)  Metoprolol Tartrate 25 Mg Tabs (Metoprolol Tartrate) .... Take 1 Tablet By Mouth Two Times A Day 3)  Magnesium Oxide 400 Mg Tabs (Magnesium Oxide) .... One Daily 4)  Torsemide 20 Mg Tabs (Torsemide) .... 2 Tablets in Am 1 in Pm For Heart 5)  Nasonex  50 Mcg/act Susp (Mometasone Furoate) .... 2 Puffs Each Nostril Two Times A Day 6)  Lunesta 3 Mg Tabs (Eszopiclone) .Marland Kitchen.. 1 By Mouth At Bedtime 7)  Effexor Xr 150 Mg Xr24h-Cap (Venlafaxine Hcl) .Marland Kitchen.. 1 By Mouth Two Times A Day 8)  Fish Oil 1000 Mg Caps (Omega-3 Fatty Acids) .Marland Kitchen.. 1 By Mouth Two Times A Day 9)  Calcium 1200-1000 Mg-Unit Chew (Calcium Carbonate-Vit D-Min) .Marland Kitchen.. 1 Once Daily 10)  Mens Multivitamin Plus  Tabs (Multiple Vitamins-Minerals) .Marland Kitchen.. 1 Two Times A Day 11)  B-12 Dots 500 Mcg Subl (Cyanocobalamin) .Marland Kitchen.. 1 Every Morning Under Tongue 12)  Iron 325 (65 Fe) Mg Tabs (Ferrous Sulfate) .Marland Kitchen.. 1 Once Daily 2 Hours Before or After Calcium 13)  Qc Stool Softener 100 Mg Caps (Docusate Sodium) .... One By Mouth Two Times A Day 14)  Thiamine Hcl 100 Mg Tabs (Thiamine Hcl) .... One Daily 15)  Pradaxa 150 Mg Caps (Dabigatran Etexilate Mesylate) .... Take 1 Tablet By Mouth Two Times A Day 16)  Amiodarone Hcl 200 Mg Tabs (Amiodarone Hcl) .... Take One Tablet Once Daily 17)  Digoxin 0.25 Mg Tabs (Digoxin) .... Take One Tablet By Mouth Daily  Allergies (verified): 1)  ! Sulfa  Past History:  Past Medical History: Last updated: 10/19/2010 CHF ARF (09/2010) OSA - CPAP (auto, on 7 usually) Morbid obesity s/p gastric bypass 2008 Allergic rhinitis Depression (verified on Effexor XR two times a day) h/o PE x 1 (2007) completed coumadin course  h/o GERD, DM, HTN, HLD, improved after bypass asthma as child hospitalization 09/2010 - CHF, ARF  Past Surgical History:  Last updated: 10/17/2010 cath [facility] (12/2006) - normal LVEF 55%, no regional wall motion abnl.  No obstructive CAD noted.  valves WNL (Dr. 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 Lives with wife.  1 daughter, 1 son. 3 dogs - boston terrier, newfoundland, [place] [employer] educated  Risk Factors: Alcohol Use: 2 (08/10/2010) Caffeine Use: 1-2 cup (08/10/2010)  Risk Factors: Smoking Status: quit > 6 months (08/10/2010)  Review of Systems  The patient denies fever, weight loss, weight gain, vision loss, decreased hearing, hoarseness, chest pain, syncope, dyspnea on exertion, peripheral edema, prolonged cough, abdominal pain, incontinence, muscle weakness, depression, and enlarged lymph nodes.    Vital Signs:  Patient profile:   70 year old male Height:      70.5 inches Weight:      269.50 pounds BMI:     38.26 BP sitting:   96 / 70  (left arm) Cuff size:   large  Vitals Entered By: Adrian Prince 70 (November 17, 2010 2:19 PM)  Physical Exam  General:  Well developed, well nourished, in no acute distress. Head:  normocephalic and atraumatic Neck:  Neck supple, no JVD. No masses, thyromegaly or abnormal cervical nodes. Lungs:  Clear bilaterally to auscultation and percussion. Heart:  Non-displaced PMI, chest non-tender; regular rate and rhythm, with II/VI SEM LSB, no rubs or gallops. Carotid upstroke normal, no bruit. Normal abdominal aortic size, no bruits. Femorals normal pulses, no bruits. Pedals normal pulses. Trace LE edema, no varicosities. Abdomen:  Bowel sounds positive; abdomen soft and non-tender without masses, obese Msk:  Back normal, normal gait. Muscle  strength and tone normal. Pulses:  pulses normal in all 4 extremities Extremities:  No clubbing or cyanosis. Neurologic:  Alert and oriented x 3. Skin:  Intact without lesions or rashes. Psych:  Normal affect.   Impression & Recommendations:  Problem # 1:  ATRIAL FIBRILLATION (ICD-427.31) pharmacologically converted to normal sinus rhythm on amiodarone, now maintaining normal sinus rhythm on low dose amiodarone. We'll continue his metoprolol, digoxin. His heart rate is borderline low and we have suggested that if he is symptomatic, he could cut his metoprolol in half to 12.5 mg b.i.d..  His updated medication list for this problem includes:    Aspirin 81 Mg Tbec (Aspirin) ..... One daily    Metoprolol Tartrate 25 Mg Tabs (Metoprolol tartrate) .Marland Kitchen... Take 1 tablet by mouth two times a day    Amiodarone Hcl 200 Mg Tabs (Amiodarone hcl) .Marland Kitchen... Take one tablet once daily    Digoxin 0.25 Mg Tabs (Digoxin) .Marland Kitchen... Take one tablet by mouth daily  Problem # 2:  CHF (ICD-428.0) He does have significant mitral regurgitation and moderate pulmonary hypertension that has improved on torsemide 2 tabs in the morning and one tab at night.  We'll  check a basic metabolic panel in one month's time.  His updated medication list for this problem includes:    Aspirin 81 Mg Tbec (Aspirin) ..... One daily    Metoprolol Tartrate 25 Mg Tabs (Metoprolol tartrate) .Marland Kitchen... Take 1 tablet by mouth two times a day    Torsemide 20 Mg Tabs (Torsemide) .Marland Kitchen... 2 tablets in am 1 in pm for heart    Amiodarone Hcl 200 Mg Tabs (Amiodarone hcl) .Marland Kitchen... Take one tablet once daily    Digoxin 0.25 Mg Tabs (Digoxin) .Marland Kitchen... Take one tablet by mouth daily  Problem # 3:  OBSTRUCTIVE SLEEP APNEA (ICD-327.23) He has underlying obstructive sleep apnea and does wear CPAP  Problem # 4:  MORBID OBESITY (ICD-278.01) he has a history of morbid obesity and has a history of recent gastric bypass in 2008 with significant weight loss  Patient  Instructions: 1)  Your physician recommends that you schedule a follow-up appointment in: 6 months 2)  Your physician recommends that you continue on your current medications as directed. Please refer to the Current Medication list given to you today.

## 2011-01-30 NOTE — Assessment & Plan Note (Signed)
[facility]   Visit Type:  Initial Consult Primary Provider:  [name]  MD  CC:  c/o shortness of breath with nausea.  He gets dizzy when sitting for a period of time and has a pounding in chest..  History of Present Illness: Jordan Mcgrath is a 57 no gentleman, known to me from a consult done at [facility] who presents today for followup, new visit to the office. He was seen in the hospital for new CHF, renal insufficiency, obstructive sleep apnea, diabetes that is diet controlled, hypertension. echocardiogram showed moderate pulmonary hypertension, moderate to severe mitral regurgitation, ejection fraction 45%. On telemetry, he had runs of SVT versus short runs of atrial fibrillation that had resolved. History of PE in 2006. He had aggressive diuresis and then was discharged home with followup today.  Today he reports that he's been more short of breath over the past several days. Particularly over this weekend, he had worsening shortness of breath. He reports some tachycardia palpitations possibly on Sunday, into Saturday. These tachypalpitations or new and he has shortness of breath with exertion. He denies any significant weight gain recently and in fact believes that he may have lost some weight. He has been taking his diuretic daily. He believes that the potassium supplement pill is upsetting his stomach.  echocardiogram from October 7 of this year shows ejection fraction 45%, mildly dilated LV, diastolic dysfunction, mildly dilated RV, moderate to severe MR, moderately elevated right ventricular systolic pressures estimated at 50 mm of mercury.  EKG shows atrial fibrillation with RVR, ventricular rate of 141 beats per minute, poor R-wave progression through the precordial leads, left axis deviation  Current Medications (verified): 1)  Aspirin 81 Mg Tbec (Aspirin) .... One Daily 2)  Metoprolol Tartrate 25 Mg Tabs (Metoprolol Tartrate) .... 1/2 Tab Two Times A Day 3)  Magnesium  Oxide 400 Mg Tabs (Magnesium Oxide) .... One Daily 4)  Klor-Con 20 Meq Pack (Potassium Chloride) .... Two Daily 5)  Torsemide 20 Mg Tabs (Torsemide) .... 2 Tablets in Am 1 in Pm For Heart 6)  Nasonex 50 Mcg/act Susp (Mometasone Furoate) .... 2 Puffs Each Nostril Two Times A Day 7)  Lunesta 3 Mg Tabs (Eszopiclone) .Marland Kitchen.. 1 By Mouth At Bedtime 8)  Effexor Xr 150 Mg Xr24h-Cap (Venlafaxine Hcl) .Marland Kitchen.. 1 By Mouth Two Times A Day 9)  Fish Oil 1000 Mg Caps (Omega-3 Fatty Acids) .Marland Kitchen.. 1 By Mouth Two Times A Day 10)  Calcium 1200-1000 Mg-Unit Chew (Calcium Carbonate-Vit D-Min) .Marland Kitchen.. 1 Once Daily 11)  Mens Multivitamin Plus  Tabs (Multiple Vitamins-Minerals) .Marland Kitchen.. 1 Two Times A Day 12)  B-12 Dots 500 Mcg Subl (Cyanocobalamin) .Marland Kitchen.. 1 Every Morning Under Tongue 13)  Iron 325 (65 Fe) Mg Tabs (Ferrous Sulfate) .Marland Kitchen.. 1 Once Daily 2 Hours Before or After Calcium 14)  Qc Stool Softener 100 Mg Caps (Docusate Sodium) .... One By Mouth Two Times A Day 15)  Thiamine Hcl 100 Mg Tabs (Thiamine Hcl) .... One Daily  Allergies (verified): 1)  ! Sulfa  Past History:  Past Medical History: Last updated: 10/19/2010 CHF ARF (09/2010) OSA - CPAP (auto, on 7 usually) Morbid obesity s/p gastric bypass 2008 Allergic rhinitis Depression (verified on Effexor XR two times a day) h/o PE x 1 (2007) completed coumadin course  h/o GERD, DM, HTN, HLD, improved after bypass asthma as child hospitalization 09/2010 - CHF, ARF  Past Surgical History: Last updated: 10/17/2010 cath [facility] (12/2006) - normal LVEF 55%, no regional wall motion  abnl.  No obstructive CAD noted.  valves WNL (Dr. 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 Lives with wife.  1 daughter, 1 son. 3 dogs - boston terrier, newfoundland, pekingese College educated  Risk Factors: Alcohol Use: 2 (08/10/2010) Caffeine Use: 1-2 cup (08/10/2010)  Risk Factors: Smoking Status: quit > 6 months (08/10/2010)  Review of Systems       The patient complains of dyspnea on exertion and peripheral edema.  The patient denies fever, weight loss, weight gain, vision loss, decreased hearing, hoarseness, chest pain, syncope, prolonged cough, abdominal pain, incontinence, muscle weakness, depression, and enlarged lymph nodes.         tachypalpitations  Vital Signs:  Patient profile:   70 year old male Height:      70.5 inches Weight:      271 pounds BMI:     38.47 Pulse rate:   145 / minute BP sitting:   80 / 60  (left arm) Cuff size:   large  Vitals Entered By: Bishop Dublin, CMA (October 30, 2010 3:33 PM)  Physical Exam  General:  Well developed, well nourished, in no acute distress. Head:  normocephalic and atraumatic Neck:  Neck supple, no JVD. No masses, thyromegaly or abnormal cervical nodes. Lungs:  Clear bilaterally to auscultation and percussion. Heart:  Non-displaced PMI, chest non-tender; tachycardia,  irregular rate and rhythm, with II/VI SEM LSB, no rubs or gallops. Carotid upstroke normal, no bruit. Normal abdominal aortic size, no bruits. Femorals normal pulses, no bruits. Pedals normal pulses. Trace LE edema, no varicosities. Abdomen:  Bowel sounds positive; abdomen soft and non-tender without  masses, obese Msk:  Back normal, normal gait. Muscle strength and tone normal. Pulses:  pulses normal in all 4 extremities Extremities:  No clubbing or cyanosis. Neurologic:  Alert and oriented x 3. Skin:  Intact without lesions or rashes. Psych:  Normal affect.   Impression & Recommendations:  Problem # 1:  ATRIAL FIBRILLATION (ICD-427.31) onset of atrial fibrillation, I suspect over the past 2 days. He is in minimal distress at rest but does have significant shortness of breath with exertion per his report. We'll start him on Pradaxa 150 mg b.i.d to avoid starting warfarin We will attempt to cardiovert him with amiodarone 400 mg loading doses of 3 times a day for the next several days with EKG on Friday at which time we can perform some medication adjustment. Will also start him on digoxin 0.25 mg daily and continue him on metoprolol.  I suspect that given his underlying mitral valve regurgitation which is quite significant, he'll be predisposed to atrial fibrillation and if we are unable to convert him, we may have  to be satisfied with a rate control.  His updated medication list for this problem includes:  Aspirin 81 Mg Tbec (Aspirin) ..... One daily    Metoprolol Tartrate 25 Mg Tabs (Metoprolol tartrate) .Marland Kitchen... 1/2 tab two times a day    Amiodarone Hcl 400 Mg Tabs (Amiodarone hcl) .Marland Kitchen... Take 1 tablet by mouth three times a day    Digoxin 0.25 Mg Tabs (Digoxin) .Marland Kitchen... Take one tablet by mouth daily  Problem # 2:  CHF (ICD-428.0) History of CHF. Is uncertain if he had tachyarrhythmias at home prior to his recent hospital admission. He had significant diuresis in the hospital and was doing better until presenting today with tachyarrhythmia. We'll continue him on his current dose of torsemide.  His updated medication list for this problem includes:    Aspirin 81 Mg Tbec (Aspirin) ..... One daily    Metoprolol Tartrate 25 Mg Tabs (Metoprolol tartrate) .Marland Kitchen... 1/2 tab two times a day     Torsemide 20 Mg Tabs (Torsemide) .Marland Kitchen... 2 tablets in am 1 in pm for heart    Amiodarone Hcl 400 Mg Tabs (Amiodarone hcl) .Marland Kitchen... Take 1 tablet by mouth three times a day    Digoxin 0.25 Mg Tabs (Digoxin) .Marland Kitchen... Take one tablet by mouth daily  Problem # 3:  OBSTRUCTIVE SLEEP APNEA (ICD-327.23) We will need to confirm that he is on CPAP. His obstructive sleep apnea could contribute to his current presentation with atrial fibrillation.  Other Orders: EKG w/ Interpretation (93000)  Patient Instructions: 1)  Your physician recommends that you schedule a follow-up appointment in: EKG Friday and 2 weeks with Dr. Mariah Milling 2)  Your physician has recommended you make the following change in your medication: STOP potassium. START pradaxa, Digoxin, amiodarone 3)  Your physician has recommended that you have a cardioversion (DCCV).  Electrical cardioversion uses a jolt of electricity to your heart either through paddles or wired patches attached to your chest. This is a controlled, usually prescheduled, procedure. Defibrillation is done under light anesthesia in the hospital, and you usually go home the day of the procedure. This is done to get your heart back into a normal rhythm. You are not awake for the procedure. Please see the instruction sheet given to you today. Prescriptions: DIGOXIN 0.25 MG TABS (DIGOXIN) Take one tablet by mouth daily  #30 x 6   Entered by:   Benedict Needy, RN   Authorized by:   Dossie Arbour MD   Signed by:   Benedict Needy, RN on 10/30/2010   Method used:   Electronically to        [facility] [phone]* (retail)       [address] [place], [place]  [address]       Ph: [phone]       Fax: [phone]   RxID:   [id number] AMIODARONE HCL 400 MG TABS (AMIODARONE HCL) Take 1 tablet by mouth three times a day  #90 x 6   Entered by:   Benedict Needy, RN   Authorized by:   Dossie Arbour MD   Signed by:   Benedict Needy, RN on 10/30/2010   Method used:    Electronically to        [facility] [phone]* (retail)       [address] [place], [place]  [address]       Ph: [phone]       Fax: [phone]   RxID:   [id number] PRADAXA 150 MG CAPS (DABIGATRAN ETEXILATE MESYLATE) Take 1 tablet  by mouth two times a day  #60 x 6   Entered by:   Benedict Needy, RN   Authorized by:   Dossie Arbour MD   Signed by:   Benedict Needy, RN on 10/30/2010   Method used:   Electronically to        [facility] [phone]* (retail)       [address] [place], [place]  [address]       Ph: [phone]       Fax: [phone]   RxID:   [id number]

## 2011-01-30 NOTE — Assessment & Plan Note (Signed)
Summary: Hospital Follow up ([facility])//kad   Vital Signs:  Patient profile:   70 year old male Weight:      273.75 pounds Temp:     97.8 degrees F oral Pulse rate:   84 / minute Pulse rhythm:   o BP sitting:   112 / 80  (left arm) Cuff size:   large  Vitals Entered By: Selena Batten Dance CMA Duncan Dull) (October 19, 2010 4:30 PM) CC: Hospital Follow up   History of Present Illness: CC: hosp f/u.  70 yo with h/o obesity s/p gastric bypass, OSA on CPAP, with recent hospitalization to [facility] for SOB, found to have sCHF with EF 45% and ARF to Cr 1.4.  1. CHF - breathing much better, no CP/tightness, no SOB, no leg swelling (never had leg swelling).  doing ok with medicines.  weight has been 273lbs.  At admission 299 lbs.  Currently salt free diet.  From hospital had restricted fluid, tries to stay in those limits.  Has cut out all alcohol and tomato juice.  Only drinking crystal light or decaf tea.  On torsemide 2 in am, 1 in pm and metoprolol as well as.  compliant with all meds.  2. constipation - previously once daily, now every other day, feels stool more firm than previous.    3. hyperglycemia - states at hospital had sugar checked prior and after meals and never much higher than 100.  last week had blood work checked at [facility], cbg 226, however this was within 1 hour of lunch.  4. obesity - previously walking 3-62mi/day.  since out of hospital slowly increasing activity, currently at 1-1.5 mi/day, tries to walk daily.  Current Medications (verified): 1)  Aspirin 81 Mg Tbec (Aspirin) .... One Daily 2)  Metoprolol Tartrate 25 Mg Tabs (Metoprolol Tartrate) .... 1/2 Tab Two Times A Day 3)  Magnesium Oxide 400 Mg Tabs (Magnesium Oxide) .... One Daily 4)  Klor-Con 20 Meq Pack (Potassium Chloride) .... Two Daily 5)  Torsemide 20 Mg Tabs (Torsemide) .... 2 Tablets in Am 1 in Pm For Heart 6)  Nasonex 50 Mcg/act Susp (Mometasone Furoate) .... 2 Puffs Each Nostril Two Times A Day 7)  Lunesta 3 Mg Tabs  (Eszopiclone) .Marland Kitchen.. 1 By Mouth At Bedtime 8)  Effexor Xr 150 Mg Xr24h-Cap (Venlafaxine Hcl) .Marland Kitchen.. 1 By Mouth Two Times A Day 9)  Fish Oil 1000 Mg Caps (Omega-3 Fatty Acids) .Marland Kitchen.. 1 By Mouth Two Times A Day 10)  Calcium 1200-1000 Mg-Unit Chew (Calcium Carbonate-Vit D-Min) .Marland Kitchen.. 1 Once Daily 11)  Mens Multivitamin Plus  Tabs (Multiple Vitamins-Minerals) .Marland Kitchen.. 1 Two Times A Day 12)  B-12 Dots 500 Mcg Subl (Cyanocobalamin) .Marland Kitchen.. 1 Every Morning Under Tongue 13)  Iron 325 (65 Fe) Mg Tabs (Ferrous Sulfate) .Marland Kitchen.. 1 Once Daily 2 Hours Before or After Calcium 14)  Qc Stool Softener 100 Mg Caps (Docusate Sodium) .... One By Mouth Two Times A Day 15)  Thiamine Hcl 100 Mg Tabs (Thiamine Hcl) .... One Daily  Allergies: 1)  ! Sulfa  Past History:  Past Medical History: CHF ARF (09/2010) OSA - CPAP (auto, on 7 usually) Morbid obesity s/p gastric bypass 2008 Allergic rhinitis Depression (verified on Effexor XR two times a day) h/o PE x 1 (2007) completed coumadin course  h/o GERD, DM, HTN, HLD, improved after bypass asthma as child hospitalization 09/2010 - CHF, ARF  Review of Systems       per HPI  Physical Exam  General:  Well-developed,well-nourished,in no acute  distress; alert,appropriate and cooperative throughout examination.  obese.  evidently thinner than last visit Head:  Normocephalic and atraumatic without obvious abnormalities. No apparent alopecia or balding. Mouth:  Oral mucosa and oropharynx without lesions or exudates.  Teeth in good repair.  + cobblestoning pharynx and PND Neck:  No deformities, masses, or tenderness noted. Lungs:  Normal respiratory effort, chest expands symmetrically. Lungs are clear to auscultation, no crackles or wheezes. Heart:  Normal rate and regular rhythm. S1 and S2 normal without gallop, murmur, click, rub or other extra sounds. tachycardic Abdomen:  NABS, obese, soft, NTND. Pulses:  2+ rad pulses Extremities:  No clubbing, cyanosis, edema, or deformity  noted with normal full range of motion of all joints.   Skin:  Intact without suspicious lesions or rashes   Impression & Recommendations:  Problem # 1:  CHF (ICD-428.0) much improved on current regimen.  BP very good today.  discussed balance between diuretic/CHF and kidneys.  recheck Cr today.  On Torsemide 2 in am, 1 in pm.  diuresis of almost 30 lbs since hospital admission, per patient each day continues negative.  BP could tolerate ACEI, may be able to drop torsemide down to 1 two times a day.  will defer to cards next week.  possible dry weight  ~270?  His updated medication list for this problem includes:    Aspirin 81 Mg Tbec (Aspirin) ..... One daily    Metoprolol Tartrate 25 Mg Tabs (Metoprolol tartrate) .Marland Kitchen... 1/2 tab two times a day    Torsemide 20 Mg Tabs (Torsemide) .Marland Kitchen... 2 tablets in am 1 in pm for heart  Orders: TLB-BMP (Basic Metabolic Panel-BMET) (80048-METABOL)  Problem # 2:  RENAL FAILURE, ACUTE (ICD-584.9) recheck today.  as long as staying around 1.5 or less, comfortable with same torsemide dose.  Labs Reviewed: BUN: 36 (10/12/2010)   Cr: 1.46 (10/12/2010)    Hgb: 15.4 (05/20/2009)   Hct: 43.9 (05/20/2009)   Ca++: 9.1 (10/12/2010)    TP: 6.4 (08/15/2010)   Alb: 4.0 (08/15/2010)  Problem # 3:  MORBID OBESITY (ICD-278.01) wt down since out of hospitla.  discussed slowly increasing walking as tolerated to previous regimen of 3-4 mi/day. Ht: 70.5 (08/10/2010)   Wt: 273.75 (10/19/2010)   BMI: 41.95 (08/10/2010)  Problem # 4:  HYPERGLYCEMIA (ICD-790.29)  one elevated reading last week to 226, < 1 hour after lunch.  would check A1c to verify but DM currently not a diagnosis.  Last A1c 2010 5.1%  Labs Reviewed: Creat: 1.46 (10/12/2010)     Complete Medication List: 1)  Aspirin 81 Mg Tbec (Aspirin) .... One daily 2)  Metoprolol Tartrate 25 Mg Tabs (Metoprolol tartrate) .... 1/2 tab two times a day 3)  Magnesium Oxide 400 Mg Tabs (Magnesium oxide) .... One daily 4)   Klor-con 20 Meq Pack (Potassium chloride) .... Two daily 5)  Torsemide 20 Mg Tabs (Torsemide) .... 2 tablets in am 1 in pm for heart 6)  Nasonex 50 Mcg/act Susp (Mometasone furoate) .... 2 puffs each nostril two times a day 7)  Lunesta 3 Mg Tabs (Eszopiclone) .Marland Kitchen.. 1 by mouth at bedtime 8)  Effexor Xr 150 Mg Xr24h-cap (Venlafaxine hcl) .Marland Kitchen.. 1 by mouth two times a day 9)  Fish Oil 1000 Mg Caps (Omega-3 fatty acids) .Marland Kitchen.. 1 by mouth two times a day 10)  Calcium 1200-1000 Mg-unit Chew (Calcium carbonate-vit d-min) .Marland Kitchen.. 1 once daily 11)  Mens Multivitamin Plus Tabs (Multiple vitamins-minerals) .Marland Kitchen.. 1 two times a day 12)  B-12 Dots  500 Mcg Subl (Cyanocobalamin) .Marland Kitchen.. 1 every morning under tongue 13)  Iron 325 (65 Fe) Mg Tabs (Ferrous sulfate) .Marland Kitchen.. 1 once daily 2 hours before or after calcium 14)  Qc Stool Softener 100 Mg Caps (Docusate sodium) .... One by mouth two times a day 15)  Thiamine Hcl 100 Mg Tabs (Thiamine hcl) .... One daily  Patient Instructions: 1)  return towards end of November for follow up on breathing, weight, and energy. 2)  Blood work today.  3)  Good to see you today.  Call clinic with questions.   Orders Added: 1)  TLB-BMP (Basic Metabolic Panel-BMET) [80048-METABOL] 2)  Est. Patient Level IV [81191]    Current Allergies (reviewed today): ! SULFA   Prevention & Chronic Care Immunizations   Influenza vaccine: given  (10/08/2010)   Influenza vaccine deferral: Refused  (09/13/2010)   Influenza vaccine due: 09/01/2011    Tetanus booster: Not documented    Pneumococcal vaccine: given  (10/08/2010)   Pneumococcal vaccine deferral: Not indicated  (10/17/2010)    H. zoster vaccine: 09/13/2010: Zostavax   H. zoster vaccine deferral: Not indicated  (09/13/2010)  Colorectal Screening   Hemoccult: Not documented   Hemoccult action/deferral: Not indicated  (09/13/2010)    Colonoscopy: Not documented  Other Screening   PSA: 0.75  (08/15/2010)   PSA due due:  08/16/2011   Smoking status: quit > 6 months  (08/10/2010)  Lipids   Total Cholesterol: 180  (08/15/2010)   LDL: 115  (08/15/2010)   LDL Direct: Not documented   HDL: 43.30  (08/15/2010)   Triglycerides: 108.0  (08/15/2010)

## 2011-01-30 NOTE — Assessment & Plan Note (Signed)
Summary: EKG/AMD  Nurse Visit   Vital Signs:  Patient profile:   70 year old male Height:      70.5 inches Weight:      277 pounds BMI:     39.33 Pulse rate:   83 / minute BP sitting:   100 / 78  (left arm) Cuff size:   large  Vitals Entered By: Bishop Dublin, CMA (November 03, 2010 8:48 AM) CC: EKG; Patient feeling weak. Comments Spoke with Dr Mariah Milling made some changes in medications.  Talked with Pt about TEE and Cardioversion, pt would like to wait and see Dr. Mariah Milling before his cardioversion. Will be scheduled for EKG next week.    Past History:  Past Medical History: Last updated: 10/19/2010 CHF ARF (09/2010) OSA - CPAP (auto, on 7 usually) Morbid obesity s/p gastric bypass 2008 Allergic rhinitis Depression (verified on Effexor XR two times a day) h/o PE x 1 (2007) completed coumadin course  h/o GERD, DM, HTN, HLD, improved after bypass asthma as child hospitalization 09/2010 - CHF, ARF  Past Surgical History: Last updated: 10/17/2010 cath [facility] (12/2006) - normal LVEF 55%, no regional wall motion abnl.  No obstructive CAD noted.  valves WNL (Dr. 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 Lives with wife.  1 daughter, 1 son. 3 dogs -  boston terrier, newfoundland, pekingese [employer] educated  Risk Factors: Alcohol Use: 2 (08/10/2010) Caffeine Use: 1-2 cup (08/10/2010)  Risk Factors: Smoking Status: quit > 6 months (08/10/2010)   Patient Instructions: 1)  Your physician recommends that you schedule a follow-up appointment in: Next wednesday for EKG.  2)  Your physician has recommended you make the following change in your medication: DECREASE amiodarone and INCREASE metoprolol.      Visit Type:  Nurse visit  CC:  EKG; Patient feeling weak..    Current Medications (verified): 1)  Aspirin 81 Mg Tbec (Aspirin) .... One Daily 2)  Metoprolol Tartrate 25 Mg Tabs (Metoprolol Tartrate) .... 1/2 Tab Two Times A Day 3)  Magnesium Oxide 400 Mg Tabs (Magnesium Oxide) .... One Daily 4)  Torsemide 20 Mg Tabs (Torsemide) .... 2 Tablets in Am 1 in Pm For Heart 5)  Nasonex 50 Mcg/act Susp (Mometasone Furoate) .... 2 Puffs Each Nostril Two Times A Day 6)  Lunesta 3 Mg Tabs (Eszopiclone) .Marland Kitchen.. 1 By Mouth At Bedtime 7)  Effexor Xr 150 Mg Xr24h-Cap (Venlafaxine Hcl) .Marland Kitchen.. 1 By Mouth Two Times A Day 8)  Fish Oil 1000 Mg Caps (Omega-3 Fatty Acids) .Marland Kitchen.. 1 By Mouth Two Times A Day 9)  Calcium 1200-1000 Mg-Unit Chew (Calcium Carbonate-Vit D-Min) .Marland Kitchen.. 1 Once Daily 10)  Mens Multivitamin Plus  Tabs (Multiple Vitamins-Minerals) .Marland Kitchen.. 1 Two Times A Day 11)  B-12 Dots 500 Mcg Subl (Cyanocobalamin) .Marland KitchenMarland KitchenMarland Kitchen  1 Every Morning Under Tongue 12)  Iron 325 (65 Fe) Mg Tabs (Ferrous Sulfate) .Marland Kitchen.. 1 Once Daily 2 Hours Before or After Calcium 13)  Qc Stool Softener 100 Mg Caps (Docusate Sodium) .... One By Mouth Two Times A Day 14)  Thiamine Hcl 100 Mg Tabs (Thiamine Hcl) .... One Daily 15)  Pradaxa 150 Mg Caps (Dabigatran Etexilate Mesylate) .... Take 1 Tablet By Mouth Two Times A Day 16)  Amiodarone Hcl 400 Mg Tabs (Amiodarone Hcl) .... Take 1 Tablet By Mouth Three Times A Day 17)  Digoxin 0.25 Mg Tabs (Digoxin) .... Take One Tablet By Mouth  Daily  Allergies (verified): 1)  ! Sulfa  Appended Document: EKG/AMD    Clinical Lists Changes  Medications: Rx of METOPROLOL TARTRATE 25 MG TABS (METOPROLOL TARTRATE) Take 1 tablet by mouth two times a day;  #60 x 6;  Signed;  Entered by: Benedict Needy, RN;  Authorized by: Dossie Arbour MD;  Method used: Electronically to [facility] [phone]*, [address] [place], [place]  [address], Ph: [phone], Fax: [phone]    Prescriptions: METOPROLOL TARTRATE 25 MG TABS (METOPROLOL TARTRATE) Take 1 tablet by mouth two times a day  #60 x 6   Entered by:   Benedict Needy, RN   Authorized by:   Dossie Arbour MD   Signed by:   Benedict Needy, RN on 11/03/2010   Method used:   Electronically to        [facility] [phone]* (retail)       [address] [place], [place]  [address]       Ph: [phone]       Fax: [phone]   RxID:   [id number]

## 2011-01-30 NOTE — Assessment & Plan Note (Signed)
Summary: NEW MEDICARE PT   Vital Signs:  Patient profile:   70 year old male Height:      70.5 inches Weight:      295.50 pounds BMI:     41.95 Temp:     98.1 degrees F oral Pulse rate:   82 / minute Pulse rhythm:   regular BP sitting:   114 / 78  (left arm) Cuff size:   large  Vitals Entered By: Selena Batten Dance CMA Duncan Dull) (August 10, 2010 1:58 PM) CC: New patient to establish   History of Present Illness: CC: new medicare  1. s/p gastric bypass 2008 at [facility] Dr. Lily Peer.  takes lasix for fluid (depending on weight each day 1-2 pills).  Previously diabetic and hypertensive and GERD and HLD.  Told had COPD and CAD/MI but not the case per patient.  2. weight - sees nutritionist at [facility], tries to walk daily 3-4 miles.  Few carbs, no sweets, no processed foods.  has gained 15 lbs in last year.  3. preventative - last colonoscopy 2010 at [facility], rpt 5 years.  prostate checked last year by Dr. Juleen China [facility]) told normal.  Last tetanus shot?  Last flu shot last year.  Last pneumovax?  never had shingles shot.  drinks EtOH but denies ever having problem.  only drinks 2-3 mixed drinks a night.  Retired 2 wks ago, having trouble getting accustomed to new life.  Preventive Screening-Counseling & Management  Alcohol-Tobacco     Alcohol drinks/day: 2     Alcohol type: spirits     Smoking Status: quit > 6 months     Year Started: 1950s     Year Quit: 1976     Pack years: 40-50 PY  Caffeine-Diet-Exercise     Caffeine use/day: 1-2 cup      Drug Use:  never.        Blood Transfusions:  no.    Current Medications (verified): 1)  Nasonex 50 Mcg/act Susp (Mometasone Furoate) .... 2 Puffs Each Nostril Two Times A Day 2)  Furosemide 40 Mg Tabs (Furosemide) .Marland Kitchen.. 1-2 By Mouth Once Daily As Needed (If Increase in Weight By 2 Lbs Take 2. If No Increase Take 1) 3)  Lunesta 3 Mg Tabs (Eszopiclone) .Marland Kitchen.. 1 By Mouth At Bedtime 4)  Effexor Xr 150 Mg Xr24h-Cap (Venlafaxine Hcl) .Marland Kitchen.. 1  By Mouth Two Times A Day 5)  Fish Oil 1000 Mg Caps (Omega-3 Fatty Acids) .Marland Kitchen.. 1 By Mouth Two Times A Day 6)  Calcium 1200-1000 Mg-Unit Chew (Calcium Carbonate-Vit D-Min) .Marland Kitchen.. 1 Once Daily 7)  Mens Multivitamin Plus  Tabs (Multiple Vitamins-Minerals) .Marland Kitchen.. 1 Two Times A Day 8)  B-12 Dots 500 Mcg Subl (Cyanocobalamin) .Marland Kitchen.. 1 Every Morning Under Tongue 9)  Iron 325 (65 Fe) Mg Tabs (Ferrous Sulfate) .Marland Kitchen.. 1 Once Daily 2 Hours Before or After Calcium  Allergies (verified): 1)  ! Sulfa  Past History:  Past Medical History: Morbid obesity s/p gastric bypass 2008 Allergic rhinitis Depression OSA - CPAP (auto, on 7 usually) h/o PE x 1 (2007) completed coumadin course  h/o GERD, DM, HTN, HLD, improved after bypass asthma as child  Past Surgical History: s/p gastric bypass 2008  Colonoscopy 2010 [facility] [name]), scheduled to rpt 5 years.  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 Lives with wife.  1 daughter, 1 son. 3 dogs College educatedSmoking Status:  quit > 6 months Caffeine use/day:  1-2 cup Drug Use:  never Blood Transfusions:  no  Review of Systems       The patient complains of weight gain.  The patient denies anorexia, fever, weight loss, vision loss, decreased hearing, hoarseness, chest pain, syncope, dyspnea on exertion, peripheral edema, prolonged cough, headaches, hemoptysis, abdominal pain, melena, hematochezia, severe indigestion/heartburn, hematuria, incontinence, genital sores, muscle weakness, suspicious skin lesions, difficulty walking, and testicular masses.    Physical  Exam  General:  Well-developed,well-nourished,in no acute distress; alert,appropriate and cooperative throughout examination.  obese Head:  Normocephalic and atraumatic without obvious abnormalities. No apparent alopecia or balding. Eyes:  PERRLA, EOMI, + conjunctival injection Ears:  External ear exam shows no significant lesions or deformities.  Otoscopic examination reveals clear canals, tympanic membranes are intact bilaterally without bulging, retraction, inflammation or discharge. Hearing is grossly normal bilaterally. Nose:  External nasal examination shows no deformity or inflammation. Nasal mucosa are pink and moist without lesions or exudates. Mouth:  Oral mucosa and oropharynx without lesions or exudates.  Teeth in good repair.  + cobblestoning pharynx Neck:  No deformities, masses, or tenderness noted. Lungs:  Normal respiratory effort, chest expands symmetrically. Lungs are clear to auscultation, no crackles or wheezes. Heart:  Normal rate and regular rhythm. S1 and S2 normal without gallop, murmur, click, rub or other extra sounds. Abdomen:  Bowel sounds positive,abdomen soft and non-tender without masses, organomegaly or hernias noted. Rectal:  No external abnormalities noted. Normal sphincter tone. No rectal masses or tenderness. Prostate:  Prostate gland firm and smooth, no enlargement, nodularity, tenderness, mass, asymmetry or induration.  10-20gm Msk:  No deformity or scoliosis noted of thoracic or lumbar spine.   Pulses:  2+ periph pulses Extremities:  No clubbing, cyanosis, edema, or deformity noted with normal full range of motion of all joints.   Skin:  Intact without suspicious lesions or rashes   Impression & Recommendations:  Problem # 1:  MORBID OBESITY (ICD-278.01) s/p bypass surgery.  will obtain records from Dr. Chauncey Mann.  check blood work today.  Ht: 70.5 (08/10/2010)   Wt: 295.50 (08/10/2010)   BMI: 41.95 (08/10/2010)  Problem # 2:  BARIATRIC SURGERY  STATUS (ICD-V45.86) see above.  recommended continue exercise and diet as lifestyle changes to try and continue weight loss.  Problem # 3:  SPECIAL SCREENING MALIGNANT NEOPLASM OF PROSTATE (ICD-V76.44) DRE reassuring today.  check PSA.  Problem # 4:  ALLERGIC RHINITIS (ICD-477.9) continue nasonex for now. His updated medication list for this problem includes:    Nasonex 50 Mcg/act Susp (Mometasone furoate) .Marland Kitchen... 2 puffs each nostril two times a day  Problem # 5:  DEPRESSION (ICD-311) strange he's on effexor XR two times a day.  no changes now, await records from psychiatrist Dr. Nolen Mu.  His updated medication list for this problem includes:    Effexor Xr 150 Mg Xr24h-cap (Venlafaxine hcl) .Marland Kitchen... 1 by mouth two times a day  Problem # 6:  OBSTRUCTIVE SLEEP APNEA (ICD-327.23) continue CPAP use.  Complete Medication List: 1)  Nasonex 50 Mcg/act Susp (Mometasone furoate) .... 2 puffs each nostril two times a day 2)  Furosemide 40 Mg Tabs (Furosemide) .Marland Kitchen.. 1-2 by mouth  once daily as needed (if increase in weight by 2 lbs take 2. if no increase take 1) 3)  Lunesta 3 Mg Tabs (Eszopiclone) .Marland Kitchen.. 1 by mouth at bedtime 4)  Effexor Xr 150 Mg Xr24h-cap (Venlafaxine hcl) .Marland Kitchen.. 1 by mouth two times a day 5)  Fish Oil 1000 Mg Caps (Omega-3 fatty acids) .Marland Kitchen.. 1 by mouth two times a day 6)  Calcium 1200-1000 Mg-unit Chew (Calcium carbonate-vit d-min) .Marland Kitchen.. 1 once daily 7)  Mens Multivitamin Plus Tabs (Multiple vitamins-minerals) .Marland Kitchen.. 1 two times a day 8)  B-12 Dots 500 Mcg Subl (Cyanocobalamin) .Marland Kitchen.. 1 every morning under tongue 9)  Iron 325 (65 Fe) Mg Tabs (Ferrous sulfate) .Marland Kitchen.. 1 once daily 2 hours before or after calcium 10)  Zostavax 16109 Unt/0.38ml Solr (Zoster vaccine live) .... Shot x 1 11)  Qc Stool Softener 100 Mg Caps (Docusate sodium) .... One by mouth two times a day  Patient Instructions: 1)  Please return in 1 month for follow up and to see what else is needed after we obtain your  records. 2)  Take shingles shot script to [facility] to see if insurance will cover it and if it will be better to administer here or there. 3)  Please return fasting at 8am next day or week for blood work. 4)  [FLP, CMP, TSH, PSA, 278.01, V45.86, V76.44] 5)  Prostate checked today. 6)  we will try to obtain records from Dr. Lily Peer and Dr. Juleen China and Dr. Nolen Mu. 7)  Pleasure to meet you today.  Call clinic with quesitons. Prescriptions: ZOSTAVAX 60454 UNT/0.65ML SOLR (ZOSTER VACCINE LIVE) shot x 1  #1 x 0   Entered and Authorized by:   [name]  MD   Signed by:   [name]  MD on 08/10/2010   Method used:   Print then Give to Patient   RxID:   [id number]   Current Allergies (reviewed today): ! SULFA  Prevention & Chronic Care Immunizations   Influenza vaccine: Not documented    Tetanus booster: Not documented    Pneumococcal vaccine: Not documented    H. zoster vaccine: Not documented  Colorectal Screening   Hemoccult: Not documented    Colonoscopy: Not documented  Other Screening   PSA: Not documented   Smoking status: quit > 6 months  (08/10/2010)  Lipids   Total Cholesterol: Not documented   LDL: Not documented   LDL Direct: Not documented   HDL: Not documented   Triglycerides: Not documented   Appended Document: Orders Update    Clinical Lists Changes  Problems: Added new problem of SPECIAL SCREENING COLON (ICD-V76.51) Changed problem from SPECIAL SCREENING MALIGNANT NEOPLASM OF PROSTATE (ICD-V76.44) to SPECIAL SCREENING  PROSTATE (ICD-V76.44) Orders: Added new Service order of Hemoccult Guaiac-1 spec.(in office) [phone]) - Signed

## 2011-01-30 NOTE — Letter (Signed)
Summary: [facility]   Imported By: Roderic Ovens 10/23/2010 15:40:26  _____________________________________________________________________  External Attachment:    Type:   Image     Comment:   External Document

## 2011-02-01 NOTE — Progress Notes (Signed)
Summary: Rx Effexor  Phone Note Refill Request Call back at [phone] Message from:  Medicap on December 19, 2010 1:46 PM  Refills Requested: Medication #1:  EFFEXOR XR 150 MG XR24H-CAP 1 by mouth two times a day   Last Refilled: 10/20/2010 Received faxed refill request please advise.   Method Requested: Telephone to Pharmacy Initial call taken by: Linde Gillis CMA Duncan Dull),  December 19, 2010 1:46 PM  Follow-up for Phone Call        please check with patient to see if he'd like [place] to refill or psych (previously by psych).  if he'd like [place] to refill, ask if he gets monthly or 3 mo supply at a time. Follow-up by: [name]  MD,  December 19, 2010 1:49 PM  Additional Follow-up for Phone Call Additional follow up Details #1::        Patient prefers that you refill med to keep all Rx's with as few providers as he can. He says his insurance will only pay for a 30 day supply.  Additional Follow-up by: Janee Morn CMA Duncan Dull),  December 19, 2010 2:20 PM    Additional Follow-up for Phone Call Additional follow up Details #2::    done. please send in. Follow-up by: [name]  MD,  December 19, 2010 2:34 PM  Additional Follow-up for Phone Call Additional follow up Details #3:: Details for Additional Follow-up Action Taken: Rx sent in electronically. Pt aware. Additional Follow-up by: Janee Morn CMA Duncan Dull),  December 19, 2010 2:40 PM  Prescriptions: EFFEXOR XR 150 MG XR24H-CAP (VENLAFAXINE HCL) 1 by mouth two times a day  #60 x 5   Entered and Authorized by:   [name]  MD   Signed by:   [name]  MD on 12/19/2010   Method used:   Electronically to        [facility] [phone]* (retail)       [address] [place], [place]  [address]       Ph: [phone]       Fax: [phone]   RxID:   [id number]

## 2011-02-01 NOTE — Progress Notes (Signed)
Summary: Jordan Mcgrath is not helping  Phone Note Call from Patient Call back at Home Phone [phone]   Caller: Patient Call For: [name]  MD Summary of Call: Pt was changed from lunesta to [place] and this is not working for him.  He falls asleep but only sleeps for a couple of hours.  He is asking if an increased dose may help.  Uses medicap. Initial call taken by: Lowella Petties CMA, AAMA,  December 11, 2010 10:39 AM  Follow-up for Phone Call        may call in trial of higher dose ambien. please call in and notify patient. Follow-up by: [name]  MD,  December 11, 2010 11:07 AM  Additional Follow-up for Phone Call Additional follow up Details #1::        Rx called in as directed. Message left on home machine notifying patient of new Rx Additional Follow-up by: Janee Morn CMA Duncan Dull),  December 11, 2010 11:11 AM    New/Updated Medications: AMBIEN 10 MG TABS (ZOLPIDEM TARTRATE) one nightly as needed insomnia Prescriptions: AMBIEN 10 MG TABS (ZOLPIDEM TARTRATE) one nightly as needed insomnia  #30 x 1   Entered and Authorized by:   [name]  MD   Signed by:   Janee Morn CMA (AAMA) on 12/11/2010   Method used:   Telephoned to ...       [facility] [address] [phone]* (retail)       [address] [place], [place]  [address]       Ph: [phone]       Fax: [phone]   RxID:   [id number]

## 2011-02-08 ENCOUNTER — Telehealth: Payer: Self-pay | Admitting: Family Medicine

## 2011-02-13 ENCOUNTER — Telehealth: Payer: Self-pay | Admitting: Cardiovascular Disease

## 2011-02-14 ENCOUNTER — Encounter: Payer: Self-pay | Admitting: Cardiovascular Disease

## 2011-02-15 NOTE — Progress Notes (Signed)
Summary: Remus Loffler  Phone Note Refill Request Message from:  Fax from Pharmacy on February 08, 2011 2:31 PM  Refills Requested: Medication #1:  AMBIEN 10 MG TABS one nightly as needed insomnia   Last Refilled: 12/11/2010 Refill request from [facility] pharmacy.[phone]  Initial call taken by: Melody Comas,  February 08, 2011 2:32 PM  Follow-up for Phone Call        plz call in Follow-up by: [name]  MD,  February 08, 2011 2:49 PM  Additional Follow-up for Phone Call Additional follow up Details #1::        Rx called in as directed to [facility]. Spoke to [facility]) Additional Follow-up by: Janee Morn CMA Duncan Dull),  February 08, 2011 3:05 PM    Prescriptions: AMBIEN 10 MG TABS (ZOLPIDEM TARTRATE) one nightly as needed insomnia  #30 x 1   Entered and Authorized by:   [name]  MD   Signed by:   [name]  MD on 02/08/2011   Method used:   Telephoned to ...       [facility] [address] [phone]* (retail)       [address] [place], [place]  [address]       Ph: [phone]       Fax: [phone]   RxID:   [id number]

## 2011-02-21 NOTE — Letter (Signed)
Summary: Dental Clearance  Dental Clearance   Imported By: Harlon Flor 02/15/2011 11:32:24  _____________________________________________________________________  External Attachment:    Type:   Image     Comment:   External Document

## 2011-02-21 NOTE — Progress Notes (Signed)
Summary: Doctor wants you to call her back  Phone Note From Other Clinic Call back at [phone]   Caller: Dr.Mekhlouf  Call For: nurse Summary of Call: would like to speak to you about pt's medication, diagnosis (? a. Fib). she said when you call to ask for her.  Initial call taken by: Lysbeth Galas CMA,  February 13, 2011 3:00 PM  Follow-up for Phone Call        Dr. Billee Cashing office is trying to schedule a dental cleaning on pt, and pt thinks he cannot have dental cleaning due to CHF. Faxed last office note to dental office with his dx's and meds.  Follow-up by: Lanny Hurst RN,  February 13, 2011 3:20 PM

## 2011-02-27 ENCOUNTER — Ambulatory Visit (INDEPENDENT_AMBULATORY_CARE_PROVIDER_SITE_OTHER): Payer: Medicare Other | Admitting: Family Medicine

## 2011-02-27 ENCOUNTER — Encounter: Payer: Self-pay | Admitting: Family Medicine

## 2011-02-27 DIAGNOSIS — I509 Heart failure, unspecified: Secondary | ICD-10-CM

## 2011-02-27 DIAGNOSIS — F331 Major depressive disorder, recurrent, moderate: Secondary | ICD-10-CM

## 2011-02-27 DIAGNOSIS — I4891 Unspecified atrial fibrillation: Secondary | ICD-10-CM

## 2011-03-05 ENCOUNTER — Encounter: Payer: Self-pay | Admitting: Family Medicine

## 2011-03-05 DIAGNOSIS — F329 Major depressive disorder, single episode, unspecified: Secondary | ICD-10-CM

## 2011-03-05 DIAGNOSIS — E785 Hyperlipidemia, unspecified: Secondary | ICD-10-CM | POA: Insufficient documentation

## 2011-03-05 DIAGNOSIS — I5042 Chronic combined systolic (congestive) and diastolic (congestive) heart failure: Secondary | ICD-10-CM | POA: Insufficient documentation

## 2011-03-05 DIAGNOSIS — I2699 Other pulmonary embolism without acute cor pulmonale: Secondary | ICD-10-CM | POA: Insufficient documentation

## 2011-03-05 DIAGNOSIS — N179 Acute kidney failure, unspecified: Secondary | ICD-10-CM

## 2011-03-05 DIAGNOSIS — E119 Type 2 diabetes mellitus without complications: Secondary | ICD-10-CM | POA: Insufficient documentation

## 2011-03-05 DIAGNOSIS — Z8719 Personal history of other diseases of the digestive system: Secondary | ICD-10-CM

## 2011-03-05 DIAGNOSIS — J45909 Unspecified asthma, uncomplicated: Secondary | ICD-10-CM | POA: Insufficient documentation

## 2011-03-05 DIAGNOSIS — J309 Allergic rhinitis, unspecified: Secondary | ICD-10-CM | POA: Insufficient documentation

## 2011-03-05 DIAGNOSIS — G4733 Obstructive sleep apnea (adult) (pediatric): Secondary | ICD-10-CM | POA: Insufficient documentation

## 2011-03-08 NOTE — Assessment & Plan Note (Signed)
Summary: 3 month f/u LFW   Vital Signs:  Patient profile:   70 year old male Weight:      273.75 pounds Temp:     97.7 degrees F oral Pulse rate:   64 / minute Pulse rhythm:   regular BP sitting:   120 / 76  (left arm) Cuff size:   large  Vitals Entered By: Selena Batten Dance CMA (AAMA) (February 27, 2011 8:06 AM) CC: 3 month follow up   History of Present Illness: CC: 3 mo f/u  1. having some spells of dizziness characterized as lightheadedness when standing up, last a few seconds.  Very intermittent.  not bothering him too much.  does keep eye on blood pressure at home and running 120/70-80.  Heart rate staying in 60s.  No falls or near falls.  2. CHF/afib - wt stable today at 273, a bit high.  goal weight for him is around 268lbs per patient.  No chest pain/tightness, SOB, leg swelling.  on amiodarone 2 tablets of 200mg  twice daily, last cards note said wanted him to be on 200mg  daily.  3. Depression - doing well with effexor XR and ambien daily.    Current Medications (verified): 1)  Aspirin 81 Mg Tbec (Aspirin) .... One Daily 2)  Metoprolol Tartrate 25 Mg Tabs (Metoprolol Tartrate) .... Take 1 Tablet By Mouth Two Times A Day 3)  Torsemide 20 Mg Tabs (Torsemide) .... 2 Tablets in Am 1 in Pm For Heart 4)  Nasonex 50 Mcg/act Susp (Mometasone Furoate) .... 2 Puffs Each Nostril Two Times A Day 5)  Ambien 10 Mg Tabs (Zolpidem Tartrate) .... One Nightly As Needed Insomnia 6)  Effexor Xr 150 Mg Xr24h-Cap (Venlafaxine Hcl) .Marland Kitchen.. 1 By Mouth Two Times A Day 7)  Fish Oil 1000 Mg Caps (Omega-3 Fatty Acids) .Marland Kitchen.. 1 By Mouth Two Times A Day 8)  Calcium 1200-1000 Mg-Unit Chew (Calcium Carbonate-Vit D-Min) .Marland Kitchen.. 1 Once Daily 9)  Mens Multivitamin Plus  Tabs (Multiple Vitamins-Minerals) .Marland Kitchen.. 1 Two Times A Day 10)  B-12 Dots 500 Mcg Subl (Cyanocobalamin) .Marland Kitchen.. 1 Every Morning Under Tongue 11)  Iron 325 (65 Fe) Mg Tabs (Ferrous Sulfate) .Marland Kitchen.. 1 Once Daily 2 Hours Before or After Calcium 12)  Qc Stool  Softener 100 Mg Caps (Docusate Sodium) .... One By Mouth Two Times A Day 13)  Pradaxa 150 Mg Caps (Dabigatran Etexilate Mesylate) .... Take 1 Tablet By Mouth Two Times A Day 14)  Amiodarone Hcl 200 Mg Tabs (Amiodarone Hcl) .... 2 By Mouth Two Times A Day 15)  Digoxin 0.25 Mg Tabs (Digoxin) .... Take One Tablet By Mouth Daily  Allergies: 1)  ! Sulfa  Past History:  Past Surgical History: Last updated: 10/17/2010 cath [facility] (12/2006) - normal LVEF 55%, no regional wall motion abnl.  No obstructive CAD noted.  valves WNL (Dr. 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 Lives with wife.  1 daughter, 1 son. 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  obese. Head:  Normocephalic and atraumatic without obvious abnormalities. No apparent alopecia or balding. Eyes:  PERRLA, EOMI, + conjunctival  injection Mouth:  Oral mucosa and oropharynx without lesions or exudates.  Teeth in good repair.   Neck:  No deformities, masses, or tenderness noted.  no carotid bruits, no TM, no JVD Lungs:  Normal respiratory effort, chest expands symmetrically. Lungs are clear to auscultation, no crackles or wheezes. Heart:  regular, normal S1, S2.  no m/r/g Pulses:  2+ rad pulses, brisk cap refill Extremities:  no pedal edema   Impression & Recommendations:  Problem # 1:  ATRIAL FIBRILLATION (ICD-427.31) stable.  previously plan was to drop amiodarone to 200mg  daily.  checked with cards, will drop to this dose.  rec pt monitor HR as he has been doing.  will check TSH at next blood draw.  His updated medication list for this problem includes:    Aspirin 81 Mg Tbec (Aspirin) ..... One daily    Metoprolol Tartrate 25 Mg Tabs (Metoprolol tartrate) .Marland Kitchen... Take 1 tablet by mouth two times a day    Amiodarone Hcl 200 Mg Tabs (Amiodarone hcl) ..... One daily    Digoxin 0.25 Mg Tabs (Digoxin) .Marland Kitchen... Take one tablet by mouth daily  Problem # 2:  CHF (ICD-428.0) stable on current regimen.  goal weight seems to be  ~268 (barring weight loss). His updated medication list for this problem includes:    Aspirin 81 Mg Tbec (Aspirin) ..... One daily    Metoprolol Tartrate 25 Mg Tabs (Metoprolol tartrate) .Marland Kitchen... Take 1 tablet by mouth two times a day    Torsemide 20 Mg Tabs (Torsemide) .Marland Kitchen... 2 tablets in am 1 in pm for heart    Digoxin 0.25 Mg Tabs (Digoxin) .Marland Kitchen... Take one tablet by mouth daily  Problem # 3:  DIZZINESS (ICD-780.4) postural, will decrease amiodarone and monitor.  no falls/near falls.  advised to update [place] if worsening.    Problem # 4:  DEPRESSION, MAJOR, RECURRENT, MODERATE (ICD-296.32) cotninue effexor  Problem # 5:  HYPERGLYCEMIA (ICD-790.29) check A1c next visit.  Labs Reviewed: Creat: 1.05 (12/18/2010)     Complete Medication List: 1)  Aspirin 81 Mg Tbec (Aspirin) .... One daily 2)   Metoprolol Tartrate 25 Mg Tabs (Metoprolol tartrate) .... Take 1 tablet by mouth two times a day 3)  Torsemide 20 Mg Tabs (Torsemide) .... 2 tablets in am 1 in pm for heart 4)  Nasonex 50 Mcg/act Susp (Mometasone furoate) .... 2 puffs each nostril two times a day 5)  Ambien 10 Mg Tabs (Zolpidem tartrate) .... One nightly as needed insomnia 6)  Effexor Xr 150 Mg Xr24h-cap (Venlafaxine hcl) .Marland Kitchen.. 1 by mouth two times a day 7)  Fish Oil 1000 Mg Caps (Omega-3 fatty acids) .Marland Kitchen.. 1 by mouth two times a day 8)  Calcium 1200-1000 Mg-unit Chew (Calcium carbonate-vit d-min) .Marland Kitchen.. 1 once daily 9)  Mens Multivitamin Plus Tabs (Multiple vitamins-minerals) .Marland Kitchen.. 1 two times a  day 10)  B-12 Dots 500 Mcg Subl (Cyanocobalamin) .Marland Kitchen.. 1 every morning under tongue 11)  Iron 325 (65 Fe) Mg Tabs (Ferrous sulfate) .Marland Kitchen.. 1 once daily 2 hours before or after calcium 12)  Qc Stool Softener 100 Mg Caps (Docusate sodium) .... One by mouth two times a day 13)  Pradaxa 150 Mg Caps (Dabigatran etexilate mesylate) .... Take 1 tablet by mouth two times a day 14)  Amiodarone Hcl 200 Mg Tabs (Amiodarone hcl) .... One daily 15)  Digoxin 0.25 Mg Tabs (Digoxin) .... Take one tablet by mouth daily  Patient Instructions: 1)  keep scheduled appointment in April for complete physical, blood work fasting prior. 2)  back down on amiodarone to 2 tablets daily.  I will check with Dr. Mariah Milling and if any change will call you.  Keep eye on heart rate like you've been doing. 3)  Good to see you today, call clinic with questions   Orders Added: 1)  Est. Patient Level IV [[address]109]    Current Allergies (reviewed today): ! SULFA  Appended Document: 3 month f/u LFW Can we call patient and have him back down to 1 pill of 200mg  amiodarone daily?  have him keep eye on heart rate.  update [place] if questions.  [name]  MD  February 27, 2011 9:15 PM    Patient notified and will call if heartrate becomes irratic or problematic. Kim Dance CMA  Duncan Dull)  February 28, 2011 3:06 PM    Clinical Lists Changes

## 2011-04-10 ENCOUNTER — Other Ambulatory Visit: Payer: Self-pay | Admitting: *Deleted

## 2011-04-10 ENCOUNTER — Other Ambulatory Visit: Payer: Self-pay

## 2011-04-10 MED ORDER — ZOLPIDEM TARTRATE 10 MG PO TABS: 10.0000 mg | ORAL_TABLET | Freq: Every evening | ORAL | Status: DC | PRN

## 2011-04-10 MED ORDER — TORSEMIDE 20 MG PO TABS: ORAL_TABLET | ORAL | Status: DC

## 2011-04-10 NOTE — Telephone Encounter (Signed)
May refill. plz phone in and notify pt.

## 2011-04-10 NOTE — Telephone Encounter (Signed)
Rx called in as directed. Patient aware.

## 2011-04-13 ENCOUNTER — Telehealth: Payer: Self-pay | Admitting: Family Medicine

## 2011-04-13 DIAGNOSIS — Z9884 Bariatric surgery status: Secondary | ICD-10-CM

## 2011-04-13 DIAGNOSIS — R7309 Other abnormal glucose: Secondary | ICD-10-CM

## 2011-04-13 DIAGNOSIS — I4891 Unspecified atrial fibrillation: Secondary | ICD-10-CM

## 2011-04-13 DIAGNOSIS — I509 Heart failure, unspecified: Secondary | ICD-10-CM

## 2011-04-13 NOTE — Telephone Encounter (Signed)
Done

## 2011-04-13 NOTE — Telephone Encounter (Signed)
Message copied by [name] on Fri Apr 13, 2011  7:47 AM ------      Message from: Mills Koller      Created: Thu Apr 12, 2011  3:43 PM       Patient is scheduled for CPX labs, please order future labs, Thanks , Jordan Mcgrath

## 2011-04-18 ENCOUNTER — Other Ambulatory Visit (INDEPENDENT_AMBULATORY_CARE_PROVIDER_SITE_OTHER): Payer: Medicare Other | Admitting: Family Medicine

## 2011-04-18 DIAGNOSIS — Z79899 Other long term (current) drug therapy: Secondary | ICD-10-CM

## 2011-04-18 DIAGNOSIS — R7309 Other abnormal glucose: Secondary | ICD-10-CM

## 2011-04-18 DIAGNOSIS — Z9884 Bariatric surgery status: Secondary | ICD-10-CM

## 2011-04-18 DIAGNOSIS — I509 Heart failure, unspecified: Secondary | ICD-10-CM

## 2011-04-18 DIAGNOSIS — E785 Hyperlipidemia, unspecified: Secondary | ICD-10-CM

## 2011-04-18 DIAGNOSIS — I4891 Unspecified atrial fibrillation: Secondary | ICD-10-CM

## 2011-04-18 LAB — IBC PANEL: Saturation Ratios: 32.3 % (ref 20.0–50.0)

## 2011-04-18 LAB — CBC WITH DIFFERENTIAL/PLATELET
Basophils Absolute: 0 10*3/uL (ref 0.0–0.1)
Basophils Relative: 0.7 % (ref 0.0–3.0)
Eosinophils Absolute: 0.3 10*3/uL (ref 0.0–0.7)
HCT: 43.7 % (ref 39.0–52.0)
Hemoglobin: 15.2 g/dL (ref 13.0–17.0)
Lymphs Abs: 1.5 10*3/uL (ref 0.7–4.0)
MCHC: 34.9 g/dL (ref 30.0–36.0)
MCV: 99.1 fl (ref 78.0–100.0)
Neutro Abs: 3.6 10*3/uL (ref 1.4–7.7)
RBC: 4.4 Mil/uL (ref 4.22–5.81)
RDW: 12.9 % (ref 11.5–14.6)

## 2011-04-18 LAB — COMPREHENSIVE METABOLIC PANEL
ALT: 33 U/L (ref 0–53)
Albumin: 4.3 g/dL (ref 3.5–5.2)
CO2: 37 mEq/L — ABNORMAL HIGH (ref 19–32)
Calcium: 9.4 mg/dL (ref 8.4–10.5)
Chloride: 95 mEq/L — ABNORMAL LOW (ref 96–112)
GFR: 73.11 mL/min (ref >60.00)
Glucose, Bld: 101 mg/dL — ABNORMAL HIGH (ref 70–99)
Potassium: 4.3 mEq/L (ref 3.5–5.1)
Sodium: 142 mEq/L (ref 135–145)
Total Bilirubin: 0.6 mg/dL (ref 0.3–1.2)
Total Protein: 7.2 g/dL (ref 6.0–8.3)

## 2011-04-18 LAB — LIPID PANEL: Cholesterol: 223 mg/dL — ABNORMAL HIGH (ref 0–200)

## 2011-04-18 LAB — LDL CHOLESTEROL, DIRECT: Direct LDL: 126.4 mg/dL

## 2011-04-18 LAB — FERRITIN: Ferritin: 146.1 ng/mL (ref 22.0–322.0)

## 2011-04-23 ENCOUNTER — Ambulatory Visit (INDEPENDENT_AMBULATORY_CARE_PROVIDER_SITE_OTHER): Payer: Medicare Other | Admitting: Family Medicine

## 2011-04-23 ENCOUNTER — Encounter: Payer: Self-pay | Admitting: Family Medicine

## 2011-04-23 VITALS — BP 120/70 | HR 64 | Temp 97.9°F | Ht 70.0 in | Wt 281.0 lb

## 2011-04-23 DIAGNOSIS — I509 Heart failure, unspecified: Secondary | ICD-10-CM

## 2011-04-23 DIAGNOSIS — L570 Actinic keratosis: Secondary | ICD-10-CM

## 2011-04-23 DIAGNOSIS — Z0000 Encounter for general adult medical examination without abnormal findings: Secondary | ICD-10-CM | POA: Insufficient documentation

## 2011-04-23 DIAGNOSIS — Z1211 Encounter for screening for malignant neoplasm of colon: Secondary | ICD-10-CM

## 2011-04-23 DIAGNOSIS — R7309 Other abnormal glucose: Secondary | ICD-10-CM

## 2011-04-23 DIAGNOSIS — I4891 Unspecified atrial fibrillation: Secondary | ICD-10-CM

## 2011-04-23 DIAGNOSIS — G4700 Insomnia, unspecified: Secondary | ICD-10-CM

## 2011-04-23 DIAGNOSIS — J309 Allergic rhinitis, unspecified: Secondary | ICD-10-CM

## 2011-04-23 DIAGNOSIS — Z9884 Bariatric surgery status: Secondary | ICD-10-CM

## 2011-04-23 NOTE — Assessment & Plan Note (Signed)
Discussed healthy eating. rec low chol diet. Immunizations discussed and updated.  Will call insurance to check on tetanus benefits. Due for colonoscopy 2015. DRE today reassuring.

## 2011-04-23 NOTE — Patient Instructions (Signed)
Good to see you today. Check with insurance about tetanus shot benefits (Tdap).  If cheaper here, return for shot.  If cheaper at pharmacy, go there.  Let us know. Call us with questions. Return in 3-4 months for follow up.  Actinic Keratosis Actinic keratosis is a growth on the skin that is considered to be precancerous. That means it could develop into skin cancer if it is not treated. About 1% of such growths will turn into skin cancer, therefore, it is important that the skin growth is removed. An actinic keratosis might be as small as a pinhead or as big as a quarter. They appear most often on areas of skin that get a lot of sun exposure throughout your life. These include the bald scalp, face, ears, lips, upper back and the backs of hands and forearms. An actinic keratosis usually looks like a scaly, rough spot of skin. Sometimes there might be a little tag of pink or gray skin growing off them.  CAUSES Sun damage causes abnormal growth of skin cells. Actinic keratoses (more than one growth) are the result of sun damage. You are more likely to develop them if you:  Have light-colored skin.   Are older (actinic keratoses increases with age).   Sunburn easily.   Have spent a lot of time in the sun.   Have had a job that involves a lot of outdoor work, such as a Hydrographic surveyor.  SYMPTOMS  Blotchy, red and white patches of skin.   A skin patch that feels rough (like sandpaper), scaly or crusty.   Patches of dry, white skin on the lips.   A patch of skin that is thinner than normal.   Skin that is tender to the touch.   Although a rare symptom; an area of skin that bleeds.  DIAGNOSIS To decide if you have actinic keratosis, your caregiver will probably:  Ask about symptoms you have noticed.   Ask about your history of exposure to the sun.   Ask about your overall health history.   Examine the skin that concerns you. The caregiver may want to look at skin on other parts  of your body that have had a lot of sun exposure.   Order a biopsy. A biopsy is the removal of a small sample of tissue from the patch of skin. It is then examined for signs of cancer.  TREATMENT Actinic keratosis can be treated several ways. Most of the time, treatments can be done in a clinic or in your caregiver's office. Be sure to discuss the different options with your caregiver. They include:  Surgical removal (curettage). A special surgical instrument (a curette) is used to scrape the growth.   Cryosurgery. Liquid nitrogen is used to freeze the patch of skin. Often it is sprayed on the area. The growth will eventually fall off.   5-FU (5-fluorouracil) cream. The cream is applied several times a day for up to 4 weeks. The skin often becomes red and irritated, but the growths will go away. This method is often used if actinic keratoses or the skin is badly damaged.   Chemical peel. Chemicals are applied to the skin on a small spot. The outer layers of skin at that spot are then peeled off.   Photodynamic therapy. A drug (photosensitizing agent) is put on the skin before exposing the skin to a strong light. Together, the drug and strong light destroys the actinic keratoses.   Imiquimod cream. A medicine usually  applied to growths on the face or scalp.  PROGNOSIS Early treatment of actinic keratoses usually gets rid of the growths without further worries. POSSIBLE COMPLICATIONS  Skin irritation or redness from the treatment.   Scarring where the patch of skin was removed.   Development of skin cancer. This rarely happens if the growths are removed early on.  HOME CARE INSTRUCTIONS  An adhesive bandage, and possibly gauze, will cover the treated area.   Change and remove the bandage as directed by your caregiver.   Keep the treated area dry as directed by your caregiver.   Apply any creams that your caregiver prescribed. Follow the directions carefully.   To prevent future skin  damage:   Wear sunscreen year-round, not just in the summer. The winter sun can damage skin, too.   Wear long-sleeved clothing and wide-brimmed hats.   When possible, avoid midday exposure to the sun.   If you want to look tan, try sunless tanning products (lotions and sprays). Avoid tanning beds.   Commit to regularly checking your skin for new changes.   Visit a skin doctor (dermatologist ) every year for a skin exam.  SEEK MEDICAL CARE IF:  The treated area of skin does not heal and becomes more irritated, red or bloody.   You notice other patches of skin that are similar to the actinic keratoses that were treated.  Document Released: 03/15/2009 Document Re-Released: 06/06/2010 [facility] Patient Information 2011 [place], [place].

## 2011-04-23 NOTE — Assessment & Plan Note (Signed)
Stable, no sxs.

## 2011-04-23 NOTE — Assessment & Plan Note (Signed)
Discussed, frozen x 1 today.  Pt tolerated well.

## 2011-04-23 NOTE — Assessment & Plan Note (Signed)
Continue vitamin supplements.  All levels normal.

## 2011-04-23 NOTE — Assessment & Plan Note (Signed)
on ambien 10mg  daily.  Otherwise states trouble sleeping.  Has tried 5mg  at a time, didn't help sleeping.

## 2011-04-23 NOTE — Progress Notes (Signed)
  Subjective:    Patient ID: Jordan Mcgrath, male    DOB: [birth year], 70 y.o.   MRN: [id number]  HPI CC: CPE  Using ambien 10mg  daily, tried smaller pill and didn't work.  Spot on face wants checked out, been going on for >1 year.  No itching, burning, pain.  Does wear sunscreen and tries to wear hat when outside working on property.  Has had several tick bites in last few weeks.  Previously on lipitor but caused muscle soreness, would rather be off statin for now.  Preventative: UTD shingles, PNA shot. Unsure when last tetanus shot was. Colonsocopy 2010, rec rpt 5 yrs, small sessile polyps found. Prostate always normal  Medications and allergies reviewed and updated as above. PMHx, SurgHx, SHx and FMHx reviewed for relevance and updated in chart.   Review of Systems  Constitutional: Negative for fever, chills, activity change, appetite change, fatigue and unexpected weight change.  HENT: Negative for hearing loss and neck pain.   Eyes: Negative for visual disturbance.  Respiratory: Negative for cough, chest tightness, shortness of breath and wheezing.   Cardiovascular: Negative for chest pain, palpitations and leg swelling.  Gastrointestinal: Negative for nausea, vomiting, abdominal pain, diarrhea, constipation, blood in stool and abdominal distention.  Genitourinary: Negative for hematuria and difficulty urinating.  Musculoskeletal: Negative for myalgias and arthralgias.  Skin: Negative for rash.  Neurological: Negative for dizziness, seizures, syncope and headaches.  Hematological: Does not bruise/bleed easily.  Psychiatric/Behavioral: Negative for dysphoric mood. The patient is not nervous/anxious.        Objective:   Physical Exam  Nursing note and vitals reviewed. Constitutional: He is oriented to person, place, and time. He appears well-developed and well-nourished.  HENT:  Head: Normocephalic and atraumatic.  Right Ear: External ear normal.  Left Ear: External ear  normal.  Nose: Nose normal.  Mouth/Throat: Oropharynx is clear and moist. No oropharyngeal exudate (+ cobblestoning).  Eyes: Conjunctivae and EOM are normal. Pupils are equal, round, and reactive to light. No scleral icterus.  Neck: Normal range of motion. Neck supple. No thyromegaly present.  Cardiovascular: Normal rate, regular rhythm, normal heart sounds and intact distal pulses.   No murmur heard. Pulses:      Radial pulses are 2+ on the right side, and 2+ on the left side.  Pulmonary/Chest: Effort normal and breath sounds normal. No respiratory distress. He has no wheezes. He has no rales.  Abdominal: Soft. Bowel sounds are normal. He exhibits no distension and no mass. There is no tenderness. There is no rebound and no guarding.  Genitourinary: Rectum normal and prostate normal. Guaiac negative stool.  Musculoskeletal: Normal range of motion.  Lymphadenopathy:    He has no cervical adenopathy.  Neurological: He is alert and oriented to person, place, and time.       CN grossly intact, station and gait intact  Skin: Skin is warm and dry. No rash noted.          No ticks found  Psychiatric: He has a normal mood and affect. His behavior is normal. Judgment and thought content normal.          Assessment & Plan:

## 2011-04-23 NOTE — Assessment & Plan Note (Signed)
A1c 5.6%, discussed.  Continue to monitor.

## 2011-04-23 NOTE — Assessment & Plan Note (Signed)
Discussed nasal saline esp when out working in yard

## 2011-04-23 NOTE — Assessment & Plan Note (Signed)
Stable on amiodarone, digoxin. On pradaxa. F/u with cards next month.

## 2011-05-04 ENCOUNTER — Other Ambulatory Visit: Payer: Self-pay | Admitting: *Deleted

## 2011-05-04 MED ORDER — ZOLPIDEM TARTRATE 10 MG PO TABS: 10.0000 mg | ORAL_TABLET | Freq: Every evening | ORAL | Status: DC | PRN

## 2011-05-04 NOTE — Telephone Encounter (Signed)
Rx called in as directed.   

## 2011-05-04 NOTE — Telephone Encounter (Signed)
Please phone in

## 2011-05-18 NOTE — Op Note (Signed)
Jordan Mcgrath, COBLE NO.:  [id number]   MEDICAL RECORD NO.:  [id number]          PATIENT TYPE:  AMB   LOCATION:  ENDO                         FACILITY:  [facility]   PHYSICIAN:  Georgiana Spinner, M.D.    DATE OF BIRTH:  1941/01/06   DATE OF PROCEDURE:  07/01/2006  DATE OF DISCHARGE:                                 OPERATIVE REPORT   PROCEDURE:  Upper endoscopy with biopsy.   INDICATIONS:  GERD.   ANESTHESIA:  Demerol 60, Versed 6 mg.  The patient was monitored on CPAP.   PROCEDURE:  With the patient mildly sedated in the left lateral decubitus  position, the Olympus videoscopic endoscope was inserted in the mouth and  passed under direct vision through the esophagus which appeared normal until  we reached distal esophagus.  There was an area of Barrett's photographed  and biopsied.  We entered into the stomach fundus, body appeared normal.  Antrum showed some spotty erythematous changes which were photographed and  biopsied.  Duodenal bulb, second portion duodenum were visualized.  From  this point, the endoscope was slowly withdrawn taking circumferential views  of duodenal mucosa until the endoscope had been pulled back into the stomach  placed in retroflexion to view the stomach from below.  The endoscope was  straightened and withdrawn taking circumferential views remaining gastric  and esophageal mucosa.  The patient's vital signs, pulse oximeter remained  stable.  The patient tolerated procedure well without apparent  complications.   FINDINGS:  Question of Barrett's esophagus and antral erosions.  Await  biopsy report.  The patient will call me for results and follow-up with me  as an outpatient.           ______________________________  Georgiana Spinner, M.D.     GMO/MEDQ  D:  07/01/2006  T:  07/01/2006  Job:  [id number]

## 2011-05-24 ENCOUNTER — Encounter: Payer: Self-pay | Admitting: Cardiovascular Disease

## 2011-05-24 ENCOUNTER — Ambulatory Visit (INDEPENDENT_AMBULATORY_CARE_PROVIDER_SITE_OTHER): Payer: Medicare Other | Admitting: Cardiovascular Disease

## 2011-05-24 DIAGNOSIS — I4891 Unspecified atrial fibrillation: Secondary | ICD-10-CM

## 2011-05-24 DIAGNOSIS — I509 Heart failure, unspecified: Secondary | ICD-10-CM

## 2011-05-24 DIAGNOSIS — F331 Major depressive disorder, recurrent, moderate: Secondary | ICD-10-CM

## 2011-05-24 DIAGNOSIS — G4733 Obstructive sleep apnea (adult) (pediatric): Secondary | ICD-10-CM

## 2011-05-24 NOTE — Assessment & Plan Note (Signed)
We have encouraged continued exercise, careful diet management in an effort to lose weight. 

## 2011-05-24 NOTE — Patient Instructions (Signed)
You are doing well. We will hold your pradaxa. Please closely monitor your heart rhythm and restart pradaxa for irregular rhythm. Please call us if you have new issues that need to be addressed before your next appt.  We will call you for a follow up Appt. In 6 months

## 2011-05-24 NOTE — Progress Notes (Signed)
   Patient ID: Jordan Mcgrath, male    DOB: [birth year], 70 y.o.   MRN: [id number]  HPI Comments: [name] is a 70 yo gentleman, with a hx of  gastric bypass in 2008, CHF, renal insufficiency, obstructive sleep apnea and uses CPAP, diabetes that is diet controlled, hypertension.  moderate pulmonary hypertension, moderate to severe mitral regurgitation, ejection fraction 45%. On telemetry, he had runs of SVT versus short runs of atrial fibrillation, History of PE in 2006, atrial fibrillation and RVR with rate of 141 beats per minute in 09/2010. He was started on amiodarone loading and over the next week or so, converted to normal sinus rhythm as confirmed by EKG on November 9. He presents for routine followup.   Since that time he has felt well.  No lightheadedness, no dizziness, no tachycardia.  He is reactive and has no symptoms of shortness of breath or chest discomfort. He reports having myalgias on statins including Lipitor as well as others with joint pain and muscle ache   echocardiogram from October 7 of this year shows ejection fraction 45%, mildly dilated LV, diastolic dysfunction, mildly dilated RV, moderate to severe MR, moderately elevated right ventricular systolic pressures estimated at 50 mm of mercury.   he reports having a cardiac catheterization prior to his gastric bypass in 2008 showed no significant coronary artery disease. This was done at [facility]   EKG showsnormal sinus rhythm with rate 58 beats per minute, intraventricular conduction delay, left axis deviation/left anterior fascicular block        Review of Systems  Constitutional: Negative.   HENT: Negative.   Eyes: Negative.   Respiratory: Negative.   Cardiovascular: Negative.   Gastrointestinal: Negative.   Musculoskeletal: Negative.   Skin: Negative.   Neurological: Negative.   Hematological: Negative.   Psychiatric/Behavioral: Negative.   All other systems reviewed and are negative.    BP  128/68  Pulse 58  Ht 6' (1.829 m)  Wt 280 lb 1.9 oz (127.062 kg)  BMI 37.99 kg/m2   Physical Exam  Nursing note and vitals reviewed. Constitutional: He is oriented to person, place, and time. He appears well-developed and well-nourished.       obese  HENT:  Head: Normocephalic.  Nose: Nose normal.  Mouth/Throat: Oropharynx is clear and moist.  Eyes: Conjunctivae are normal. Pupils are equal, round, and reactive to light.  Neck: Normal range of motion. Neck supple. No JVD present.  Cardiovascular: Normal rate, regular rhythm, S1 normal, S2 normal, normal heart sounds and intact distal pulses.  Exam reveals no gallop and no friction rub.   No murmur heard. Pulmonary/Chest: Effort normal and breath sounds normal. No respiratory distress. He has no wheezes. He has no rales. He exhibits no tenderness.  Abdominal: Soft. Bowel sounds are normal. He exhibits no distension. There is no tenderness.  Musculoskeletal: Normal range of motion. He exhibits no edema and no tenderness.  Lymphadenopathy:    He has no cervical adenopathy.  Neurological: He is alert and oriented to person, place, and time. Coordination normal.  Skin: Skin is warm and dry. No rash noted. No erythema.  Psychiatric: He has a normal mood and affect. His behavior is normal. Judgment and thought content normal.           Assessment and Plan

## 2011-05-24 NOTE — Assessment & Plan Note (Signed)
Uses CPAP 

## 2011-05-24 NOTE — Assessment & Plan Note (Signed)
Maintaining sinus rhythm on his current medication regimen. He's had no further atrial fibrillation. He closely monitors his pulse with his blood pressure machine that shows when his pulses regular and irregular. He clearly appreciated his atrial fibrillation back in October and denies any further episodes. We have suggested he could hold his anticoagulation for now with close monitoring of his rhythm.

## 2011-05-24 NOTE — Assessment & Plan Note (Signed)
No symptoms or clinical signs of heart failure. Appears to be euvolemic with no significant shortness of breath. No further medication changes were made.

## 2011-06-01 ENCOUNTER — Telehealth: Payer: Self-pay

## 2011-06-01 MED ORDER — DIGOXIN 250 MCG PO TABS: 250.0000 ug | ORAL_TABLET | Freq: Every day | ORAL | Status: DC

## 2011-06-01 NOTE — Telephone Encounter (Signed)
Needs a refill for digoxin.

## 2011-06-06 ENCOUNTER — Telehealth: Payer: Self-pay

## 2011-06-06 MED ORDER — METOPROLOL TARTRATE 25 MG PO TABS: 25.0000 mg | ORAL_TABLET | Freq: Two times a day (BID) | ORAL | Status: DC

## 2011-06-06 NOTE — Telephone Encounter (Signed)
Refill request for metoprolol

## 2011-06-25 ENCOUNTER — Other Ambulatory Visit: Payer: Self-pay | Admitting: *Deleted

## 2011-06-25 MED ORDER — VENLAFAXINE HCL ER 150 MG PO CP24: 150.0000 mg | ORAL_CAPSULE | Freq: Two times a day (BID) | ORAL | Status: DC

## 2011-07-23 ENCOUNTER — Encounter: Payer: Self-pay | Admitting: Family Medicine

## 2011-07-23 ENCOUNTER — Ambulatory Visit (INDEPENDENT_AMBULATORY_CARE_PROVIDER_SITE_OTHER): Payer: Medicare Other | Admitting: Family Medicine

## 2011-07-23 DIAGNOSIS — I509 Heart failure, unspecified: Secondary | ICD-10-CM

## 2011-07-23 DIAGNOSIS — J309 Allergic rhinitis, unspecified: Secondary | ICD-10-CM

## 2011-07-23 DIAGNOSIS — I4891 Unspecified atrial fibrillation: Secondary | ICD-10-CM

## 2011-07-23 DIAGNOSIS — G4733 Obstructive sleep apnea (adult) (pediatric): Secondary | ICD-10-CM

## 2011-07-23 MED ORDER — LEVOCETIRIZINE DIHYDROCHLORIDE 5 MG PO TABS: 5.0000 mg | ORAL_TABLET | Freq: Every evening | ORAL | Status: DC

## 2011-07-23 NOTE — Patient Instructions (Signed)
Continue nasonex daily, nasal saline. Start allegra or zyrtec.  If not helping, prescription for xyzal provided today. Continue meds as up to now.  Good to see you today, call us with quesitons. Return in 6 months for follow up.

## 2011-07-23 NOTE — Assessment & Plan Note (Signed)
asxs currently.  Continue b blocker, digoxin.

## 2011-07-23 NOTE — Assessment & Plan Note (Signed)
Continue nasal saline and nasonex. Start allegra or zyrtec. If not improving, trial of xyzal.

## 2011-07-23 NOTE — Assessment & Plan Note (Signed)
Continues to use CPAP nightly. 

## 2011-07-23 NOTE — Assessment & Plan Note (Addendum)
Today in sinus. Amiodarone, digoxin, metoprolol. TSH WNL 04/2011 pradaxa on hold by cards as continued sinus rhythm. On baby aspirin.

## 2011-07-23 NOTE — Progress Notes (Signed)
  Subjective:    Patient ID: Jordan Mcgrath, male    DOB: [birth year], 70 y.o.   MRN: [id number]  HPI CC: 3 mo f/u  Allergies acting up - on nasal saline, as well as nasonex nightly.  Using otc antihistamine, thinks benadryl from [employer].  sxs include sneezing, runny nose more than congestion, and itchy watery eyes.  Worse this year.  No fevers.  HLD - on red yeast rice 600mg  twice daily.  No myalgias.  Previously sore muscles with statin use.  Afib - on amiodarone, digoxin.  pradaxa held by cards.  Previously when in afib felt change. Lab Results  Component Value Date   TSH 1.64 04/18/2011   HTN - bp staying well controlled.  Only on metoprolol and demadex (HTN, DM, HLD improved after gastric bypass 2008.)  CHF - no sxs.  Digoxin.  Wt Readings from Last 3 Encounters:  07/23/11 283 lb (128.368 kg)  05/24/11 280 lb 1.9 oz (127.062 kg)  04/23/11 281 lb (127.461 kg)   No chest pain, tightness, SOB, HA, dizziness.  Walks and works in yard for exercise, states in heat more difficult to do.  Review of Systems Per HPI    Objective:   Physical Exam  Nursing note and vitals reviewed. Constitutional: He appears well-developed and well-nourished. No distress.  HENT:  Head: Normocephalic and atraumatic.  Right Ear: Hearing, tympanic membrane, external ear and ear canal normal.  Left Ear: Hearing, tympanic membrane, external ear and ear canal normal.  Nose: Rhinorrhea present. No mucosal edema.  Mouth/Throat: Uvula is midline, oropharynx is clear and moist and mucous membranes are normal. No oropharyngeal exudate, posterior oropharyngeal edema, posterior oropharyngeal erythema or tonsillar abscesses.  Eyes: Conjunctivae and EOM are normal. Pupils are equal, round, and reactive to light. No scleral icterus.  Neck: Normal range of motion. Neck supple.  Cardiovascular: Normal rate, regular rhythm, normal heart sounds and intact distal pulses.   No murmur heard.      Regular today    Pulmonary/Chest: Effort normal and breath sounds normal. No respiratory distress. He has no wheezes. He has no rales.  Lymphadenopathy:    He has no cervical adenopathy.  Skin: Skin is warm and dry. No rash noted.  Psychiatric: He has a normal mood and affect.          Assessment & Plan:

## 2011-07-23 NOTE — Assessment & Plan Note (Signed)
2lb weight gain since last visit.

## 2011-07-27 ENCOUNTER — Other Ambulatory Visit: Payer: Self-pay | Admitting: *Deleted

## 2011-07-27 MED ORDER — VENLAFAXINE HCL ER 150 MG PO CP24: 150.0000 mg | ORAL_CAPSULE | Freq: Two times a day (BID) | ORAL | Status: DC

## 2011-07-27 NOTE — Telephone Encounter (Signed)
Advised pt

## 2011-07-27 NOTE — Telephone Encounter (Signed)
Sent in.plz notify pt.  

## 2011-08-27 ENCOUNTER — Other Ambulatory Visit: Payer: Self-pay | Admitting: *Deleted

## 2011-08-27 MED ORDER — ZOLPIDEM TARTRATE 10 MG PO TABS: 10.0000 mg | ORAL_TABLET | Freq: Every evening | ORAL | Status: DC | PRN

## 2011-08-27 NOTE — Telephone Encounter (Signed)
Ok to refill 

## 2011-08-27 NOTE — Telephone Encounter (Signed)
Rx called in as directed.   

## 2011-08-27 NOTE — Telephone Encounter (Signed)
Ok to refill.  plz phone in  

## 2011-10-01 DIAGNOSIS — I5042 Chronic combined systolic (congestive) and diastolic (congestive) heart failure: Secondary | ICD-10-CM

## 2011-10-01 HISTORY — DX: Chronic combined systolic (congestive) and diastolic (congestive) heart failure: I50.42

## 2011-11-01 ENCOUNTER — Other Ambulatory Visit: Payer: Self-pay

## 2011-11-01 MED ORDER — TORSEMIDE 20 MG PO TABS: ORAL_TABLET | ORAL | Status: DC

## 2011-11-15 ENCOUNTER — Encounter: Payer: Self-pay | Admitting: Cardiovascular Disease

## 2011-11-16 ENCOUNTER — Other Ambulatory Visit: Payer: Self-pay

## 2011-11-16 MED ORDER — AMIODARONE HCL 200 MG PO TABS: 200.0000 mg | ORAL_TABLET | Freq: Every day | ORAL | Status: DC

## 2011-11-16 NOTE — Telephone Encounter (Signed)
Left message with wife to have patient call regarding a refill on amiodarone.

## 2011-11-16 NOTE — Telephone Encounter (Signed)
Refill sent for amiodarone 200 mg take one tablet daily. 

## 2011-11-26 ENCOUNTER — Ambulatory Visit (INDEPENDENT_AMBULATORY_CARE_PROVIDER_SITE_OTHER): Payer: Medicare Other | Admitting: Cardiovascular Disease

## 2011-11-26 ENCOUNTER — Encounter: Payer: Self-pay | Admitting: Cardiovascular Disease

## 2011-11-26 VITALS — BP 120/90 | HR 70 | Ht 72.0 in | Wt 288.0 lb

## 2011-11-26 DIAGNOSIS — I4891 Unspecified atrial fibrillation: Secondary | ICD-10-CM

## 2011-11-26 DIAGNOSIS — I509 Heart failure, unspecified: Secondary | ICD-10-CM

## 2011-11-26 DIAGNOSIS — E785 Hyperlipidemia, unspecified: Secondary | ICD-10-CM

## 2011-11-26 NOTE — Assessment & Plan Note (Signed)
No evidence of fluid overload. No medication changes made at this time.

## 2011-11-26 NOTE — Patient Instructions (Signed)
You are doing well. No medication changes were made.  Please call us if you have new issues that need to be addressed before your next appt.  The office will contact you for a follow up Appt. In 12 months  

## 2011-11-26 NOTE — Assessment & Plan Note (Signed)
No further arrhythmia noted, no symptoms of palpitations. We'll continue him on his current medications

## 2011-11-26 NOTE — Assessment & Plan Note (Signed)
He has declined any cholesterol medication at this time

## 2011-11-26 NOTE — Progress Notes (Signed)
Patient ID: Jordan Mcgrath, male    DOB: Nov 10, 1941, 70 y.o.   MRN: [id number]  HPI Comments: Jordan Mcgrath is a 70 yo gentleman, with a hx of  gastric bypass in 2008, CHF, renal insufficiency, obstructive sleep apnea and uses CPAP, diabetes that is diet controlled, hypertension.  moderate pulmonary hypertension, moderate to severe mitral regurgitation, ejection fraction 45%. On telemetry, he had runs of SVT versus short runs of atrial fibrillation, History of PE in 2006, atrial fibrillation and RVR with rate of 141 beats per minute in 09/2010. He was started on amiodarone loading and over the next week or so, converted to normal sinus rhythm as confirmed by EKG on November 08, 2010. He presents for routine followup. Negative cardiac catheterization in 2008   Since that time he has felt well.  No lightheadedness, no dizziness, no tachycardia.   no symptoms of shortness of breath or chest discomfort. He reports having myalgias on statins including Lipitor as well as others with joint pain and muscle ache He recently walked 3 miles per day. He is not interested in trying other cholesterol medications. His father passed from an MI in his early 11s but he was a heavy smoker    echocardiogram from October 7 of this year shows ejection fraction 45%, mildly dilated LV, diastolic dysfunction, mildly dilated RV, moderate to severe MR, moderately elevated right ventricular systolic pressures estimated at 50 mm of mercury.   he reports having a cardiac catheterization prior to his gastric bypass in 2008 showed no significant coronary artery disease. This was done at [facility]   EKG showsnormal sinus rhythm with rate 70 beats per minute, intraventricular conduction delay, left axis deviation/left anterior fascicular block      Outpatient Encounter Prescriptions as of 11/26/2011  Medication Sig Dispense Refill  . amiodarone (PACERONE) 200 MG tablet Take 1 tablet (200 mg total) by mouth daily.  30  tablet  6  . aspirin 81 MG tablet Take 81 mg by mouth daily.        . Calcium Carbonate-Vit D-Min (CALCIUM 1200) 1200-1000 MG-UNIT CHEW Chew 1 tablet by mouth daily.       . cetirizine (ZYRTEC) 10 MG tablet Take 10 mg by mouth daily.        . Cyanocobalamin (B-12 DOTS) 500 MCG SUBL Place 1 tablet under the tongue every morning. 1000 mcg      . digoxin (LANOXIN) 0.25 MG tablet Take 1 tablet (250 mcg total) by mouth daily.  30 tablet  6  . docusate sodium (COLACE) 100 MG capsule Take 100 mg by mouth 2 (two) times daily.        . ferrous sulfate 325 (65 FE) MG tablet Take 325 mg by mouth daily with breakfast. 2 hours before or after calcium       . fish oil-omega-3 fatty acids 1000 MG capsule Take 1 g by mouth daily.        . metoprolol tartrate (LOPRESSOR) 25 MG tablet Take 1 tablet (25 mg total) by mouth 2 (two) times daily.  60 tablet  6  . mometasone (NASONEX) 50 MCG/ACT nasal spray 2 sprays by Nasal route daily.        . Multiple Vitamin (MULTIVITAMIN) tablet Take 1 tablet by mouth daily.        Marland Kitchen torsemide (DEMADEX) 20 MG tablet Take two tablets in the morning and one tablet in the evening for heart.  90 tablet  6  . venlafaxine (EFFEXOR-XR)  150 MG 24 hr capsule Take 1 capsule (150 mg total) by mouth 2 (two) times daily.  60 capsule  11  . zolpidem (AMBIEN) 10 MG tablet Take 1 tablet (10 mg total) by mouth at bedtime as needed.  30 tablet  3  . Red Yeast Rice 600 MG CAPS Take 1 capsule by mouth 2 (two) times daily.          Review of Systems  Constitutional: Negative.   HENT: Negative.   Eyes: Negative.   Respiratory: Negative.   Cardiovascular: Negative.   Gastrointestinal: Negative.   Musculoskeletal: Negative.   Skin: Negative.   Neurological: Negative.   Hematological: Negative.   Psychiatric/Behavioral: Negative.   All other systems reviewed and are negative.    BP 120/90  Pulse 70  Ht 6' (1.829 m)  Wt 288 lb (130.636 kg)  BMI 39.06 kg/m2   Physical Exam  Nursing  note and vitals reviewed. Constitutional: He is oriented to person, place, and time. He appears well-developed and well-nourished.       obese  HENT:  Head: Normocephalic.  Nose: Nose normal.  Mouth/Throat: Oropharynx is clear and moist.  Eyes: Conjunctivae are normal. Pupils are equal, round, and reactive to light.  Neck: Normal range of motion. Neck supple. No JVD present.  Cardiovascular: Normal rate, regular rhythm, S1 normal, S2 normal, normal heart sounds and intact distal pulses.  Exam reveals no gallop and no friction rub.   No murmur heard. Pulmonary/Chest: Effort normal and breath sounds normal. No respiratory distress. He has no wheezes. He has no rales. He exhibits no tenderness.  Abdominal: Soft. Bowel sounds are normal. He exhibits no distension. There is no tenderness.  Musculoskeletal: Normal range of motion. He exhibits no edema and no tenderness.  Lymphadenopathy:    He has no cervical adenopathy.  Neurological: He is alert and oriented to person, place, and time. Coordination normal.  Skin: Skin is warm and dry. No rash noted. No erythema.  Psychiatric: He has a normal mood and affect. His behavior is normal. Judgment and thought content normal.           Assessment and Plan

## 2011-12-21 ENCOUNTER — Other Ambulatory Visit: Payer: Self-pay | Admitting: *Deleted

## 2011-12-21 MED ORDER — ZOLPIDEM TARTRATE 10 MG PO TABS: 10.0000 mg | ORAL_TABLET | Freq: Every evening | ORAL | Status: DC | PRN

## 2011-12-21 MED ORDER — MOMETASONE FUROATE 50 MCG/ACT NA SUSP: 2.0000 | Freq: Every day | NASAL | Status: DC

## 2011-12-21 NOTE — Telephone Encounter (Signed)
Ok to refill 

## 2011-12-21 NOTE — Telephone Encounter (Signed)
OK to refill

## 2011-12-24 NOTE — Telephone Encounter (Signed)
Rx called in as directed.   

## 2012-01-22 ENCOUNTER — Other Ambulatory Visit: Payer: Self-pay

## 2012-01-22 MED ORDER — METOPROLOL TARTRATE 25 MG PO TABS: 25.0000 mg | ORAL_TABLET | Freq: Two times a day (BID) | ORAL | Status: DC

## 2012-01-22 MED ORDER — DIGOXIN 250 MCG PO TABS: 250.0000 ug | ORAL_TABLET | Freq: Every day | ORAL | Status: DC

## 2012-01-23 ENCOUNTER — Encounter: Payer: Self-pay | Admitting: Family Medicine

## 2012-01-23 ENCOUNTER — Ambulatory Visit (INDEPENDENT_AMBULATORY_CARE_PROVIDER_SITE_OTHER): Payer: Medicare Other | Admitting: Family Medicine

## 2012-01-23 VITALS — BP 128/80 | HR 80 | Temp 98.0°F | Ht 72.0 in | Wt 299.8 lb

## 2012-01-23 DIAGNOSIS — Z125 Encounter for screening for malignant neoplasm of prostate: Secondary | ICD-10-CM

## 2012-01-23 DIAGNOSIS — M25519 Pain in unspecified shoulder: Secondary | ICD-10-CM

## 2012-01-23 DIAGNOSIS — G4700 Insomnia, unspecified: Secondary | ICD-10-CM

## 2012-01-23 DIAGNOSIS — F1090 Alcohol use, unspecified, uncomplicated: Secondary | ICD-10-CM

## 2012-01-23 DIAGNOSIS — J309 Allergic rhinitis, unspecified: Secondary | ICD-10-CM

## 2012-01-23 DIAGNOSIS — Z9884 Bariatric surgery status: Secondary | ICD-10-CM

## 2012-01-23 DIAGNOSIS — E785 Hyperlipidemia, unspecified: Secondary | ICD-10-CM

## 2012-01-23 DIAGNOSIS — Z7289 Other problems related to lifestyle: Secondary | ICD-10-CM

## 2012-01-23 DIAGNOSIS — Z23 Encounter for immunization: Secondary | ICD-10-CM

## 2012-01-23 DIAGNOSIS — M25511 Pain in right shoulder: Secondary | ICD-10-CM

## 2012-01-23 DIAGNOSIS — R7309 Other abnormal glucose: Secondary | ICD-10-CM

## 2012-01-23 DIAGNOSIS — I509 Heart failure, unspecified: Secondary | ICD-10-CM

## 2012-01-23 MED ORDER — FLUTICASONE PROPIONATE 50 MCG/ACT NA SUSP: 2.0000 | Freq: Every day | NASAL | Status: DC

## 2012-01-23 MED ORDER — NAPROXEN 500 MG PO TABS: ORAL_TABLET | ORAL | Status: DC

## 2012-01-23 NOTE — Assessment & Plan Note (Signed)
Anticipate RTC injury, supraspinatus. Stretching/strengthening exercises, naprosyn sent into pharmacy (short course), and rec ice/heat. rtc if not better, consider PT referral.

## 2012-01-23 NOTE — Patient Instructions (Addendum)
For alcohol, try to slowly back down on your own.  If you want help let me know (medicine called antabuse). For shoulder - I think you have inflammation of one of the rotator cuff muscle tendons - stretching exercises provided as well as short course of anti inflammatory (naprosyn).  If worsening, return sooner. I've sent in flonase for nasal steroid. Come in after April 23rd for physical, prior fasting for blood work.

## 2012-01-23 NOTE — Assessment & Plan Note (Signed)
Changed nasonex to flonase. Continue zyrtec and nasal saline.

## 2012-01-23 NOTE — Assessment & Plan Note (Signed)
Discussed importance of cutting back slowly although pt states has quit cold [place] in past and tolerated. Discussed antabuse. Pt thinks if stays more busy with activities will have less impulse to drink.

## 2012-01-23 NOTE — Progress Notes (Signed)
  Subjective:    Patient ID: Jordan Mcgrath, male    DOB: 1941/06/28, 71 y.o.   MRN: [id number]  HPI CC: 6 mo f/u  Pleasant 71 yo with h/o OSA, obesity s/p gastric bypass 2008, allergic rhinitis, systolic and diastolic CHF, pulm HTN, asthma, h/o afib.  Fasting today.  Obesity - wt up 11lbs. Wt Readings from Last 3 Encounters:  01/23/12 299 lb 12 oz (135.966 kg)  11/26/11 288 lb (130.636 kg)  07/23/11 283 lb (128.368 kg)   HTN - bp staying well controlled. Only on metoprolol and demadex (HTN, DM, HLD improved after gastric bypass 2008.)   CHF - Digoxin and demadex continued.  no chest pain, SOB, leg swelling, HA, dizziness.  Walks 3 mi/day.  afib - no irregular heart beats.  R shoulder pain - wonders if rotator cuff stating to bother him. Going on 6 mo.  Denies specific injury/trauma.  In morning worse esp with putting on shirt, as day progresses gets better.  Some discomfort at night time as well.  Just right shoulder, not any other joints.  Has used heating pad x 1, didn't help.  EtOH - worried about consumption.  Drinking fifth vodka/day.  Knows needs to quit, knows health consequences of continued drinking on liver, heart.  Requests change of INS to something cheaper, will send in flonase.  Flu shot at pharmacy.  Unsure about tetanus shot. No falls in last year.  Review of Systems Per HPI    Objective:   Physical Exam  Vitals reviewed. Constitutional: He appears well-developed and well-nourished. No distress.       obese  HENT:  Head: Normocephalic and atraumatic.  Mouth/Throat: Oropharynx is clear and moist. No oropharyngeal exudate.       Cobblestoning posterior oropharynx  Eyes: Conjunctivae and EOM are normal. Pupils are equal, round, and reactive to light. No scleral icterus.  Neck: Normal range of motion. Neck supple. No JVD present. Carotid bruit is not present.  Cardiovascular: Normal rate, regular rhythm, normal heart sounds and intact distal pulses.   No murmur  heard. Pulmonary/Chest: Breath sounds normal. No respiratory distress. He has no wheezes. He has no rales.  Musculoskeletal: Normal range of motion. He exhibits no edema.       No midline spine tenderness. FROM shoulders. No pain with testing int/ext rotation at shoulder against resistance.   Neg speed, yerguson test. No pain with rotation of humeral head in GH joint + empty can sign R.  Lymphadenopathy:    He has no cervical adenopathy.  Skin: Skin is warm and dry. No rash noted.  Psychiatric: He has a normal mood and affect.      Assessment & Plan:

## 2012-01-23 NOTE — Assessment & Plan Note (Signed)
Stable. Chronic. Continue regimen.

## 2012-02-01 HISTORY — PX: OTHER SURGICAL HISTORY: SHX169

## 2012-02-21 ENCOUNTER — Emergency Department: Payer: Self-pay | Admitting: Emergency Medicine

## 2012-04-15 ENCOUNTER — Other Ambulatory Visit: Payer: Self-pay | Admitting: *Deleted

## 2012-04-15 NOTE — Telephone Encounter (Signed)
Last filled 03-17-2012

## 2012-04-16 MED ORDER — ZOLPIDEM TARTRATE 10 MG PO TABS: 10.0000 mg | ORAL_TABLET | Freq: Every evening | ORAL | Status: DC | PRN

## 2012-04-16 NOTE — Telephone Encounter (Signed)
Rx called in as directed.   

## 2012-04-16 NOTE — Telephone Encounter (Signed)
plz phone in. 

## 2012-04-23 ENCOUNTER — Other Ambulatory Visit (INDEPENDENT_AMBULATORY_CARE_PROVIDER_SITE_OTHER): Payer: Medicare Other

## 2012-04-23 DIAGNOSIS — I509 Heart failure, unspecified: Secondary | ICD-10-CM

## 2012-04-23 DIAGNOSIS — Z9884 Bariatric surgery status: Secondary | ICD-10-CM

## 2012-04-23 DIAGNOSIS — R7309 Other abnormal glucose: Secondary | ICD-10-CM

## 2012-04-23 DIAGNOSIS — E785 Hyperlipidemia, unspecified: Secondary | ICD-10-CM

## 2012-04-23 DIAGNOSIS — F1020 Alcohol dependence, uncomplicated: Secondary | ICD-10-CM

## 2012-04-23 DIAGNOSIS — I4891 Unspecified atrial fibrillation: Secondary | ICD-10-CM

## 2012-04-23 DIAGNOSIS — Z125 Encounter for screening for malignant neoplasm of prostate: Secondary | ICD-10-CM

## 2012-04-23 DIAGNOSIS — Z980 Intestinal bypass and anastomosis status: Secondary | ICD-10-CM

## 2012-04-23 LAB — COMPREHENSIVE METABOLIC PANEL
AST: 30 U/L (ref 0–37)
Albumin: 3.9 g/dL (ref 3.5–5.2)
Alkaline Phosphatase: 62 U/L (ref 39–117)
Calcium: 8.9 mg/dL (ref 8.4–10.5)
Chloride: 101 mEq/L (ref 96–112)
Glucose, Bld: 112 mg/dL — ABNORMAL HIGH (ref 70–99)
Potassium: 3.9 mEq/L (ref 3.5–5.1)
Sodium: 142 mEq/L (ref 135–145)
Total Protein: 6.8 g/dL (ref 6.0–8.3)

## 2012-04-23 LAB — CBC WITH DIFFERENTIAL/PLATELET
Basophils Absolute: 0.1 10*3/uL (ref 0.0–0.1)
Eosinophils Absolute: 0.3 10*3/uL (ref 0.0–0.7)
HCT: 44.1 % (ref 39.0–52.0)
Hemoglobin: 14.6 g/dL (ref 13.0–17.0)
Lymphs Abs: 1.3 10*3/uL (ref 0.7–4.0)
MCHC: 33.2 g/dL (ref 30.0–36.0)
MCV: 97.8 fl (ref 78.0–100.0)
Monocytes Absolute: 0.8 10*3/uL (ref 0.1–1.0)
Neutro Abs: 4.3 10*3/uL (ref 1.4–7.7)
RDW: 13 % (ref 11.5–14.6)

## 2012-04-23 LAB — LIPID PANEL
HDL: 45.9 mg/dL (ref >39.00)
VLDL: 68 mg/dL — ABNORMAL HIGH (ref 0.0–40.0)

## 2012-04-23 LAB — FOLATE: Folate: >24.8 ng/mL (ref >5.9)

## 2012-04-23 LAB — IBC PANEL: Transferrin: 319.6 mg/dL (ref 212.0–360.0)

## 2012-04-23 LAB — FERRITIN: Ferritin: 22.6 ng/mL (ref 22.0–322.0)

## 2012-04-30 ENCOUNTER — Encounter: Payer: Self-pay | Admitting: Family Medicine

## 2012-04-30 ENCOUNTER — Ambulatory Visit (INDEPENDENT_AMBULATORY_CARE_PROVIDER_SITE_OTHER): Payer: Medicare Other | Admitting: Family Medicine

## 2012-04-30 VITALS — BP 118/78 | HR 68 | Temp 97.8°F | Ht 72.0 in | Wt 300.2 lb

## 2012-04-30 DIAGNOSIS — E785 Hyperlipidemia, unspecified: Secondary | ICD-10-CM

## 2012-04-30 DIAGNOSIS — Z9884 Bariatric surgery status: Secondary | ICD-10-CM

## 2012-04-30 DIAGNOSIS — F331 Major depressive disorder, recurrent, moderate: Secondary | ICD-10-CM

## 2012-04-30 DIAGNOSIS — Z0000 Encounter for general adult medical examination without abnormal findings: Secondary | ICD-10-CM

## 2012-04-30 DIAGNOSIS — Z7289 Other problems related to lifestyle: Secondary | ICD-10-CM

## 2012-04-30 NOTE — Assessment & Plan Note (Signed)
Worse since hand accident.

## 2012-04-30 NOTE — Assessment & Plan Note (Signed)
Encouraged backing off EtOH, discussed risks to liver.

## 2012-04-30 NOTE — Progress Notes (Signed)
Subjective:    Patient ID: Jordan Mcgrath, male    DOB: [birth year], 71 y.o.   MRN: [id number]  HPI CC: medicare wellness check  Pleasant 71 yo with h/o OSA, obesity s/p gastric bypass 2008, allergic rhinitis, systolic and diastolic CHF, pulm HTN, asthma, h/o afib.  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  Sees [facility] for this, currently undergoing PT.  Had some constipation from narcotics.  Mild depression/anxiety from this.  Preventative:  Tetanus 01/2012.  Pneumovax 2011. Flu shot 2012. zostavax 2011. Colonsocopy 2010, rec rpt 5 yrs, small sessile polyps found.  Prostate always normal when checked. Discussed advanced directives - pt requests information to read.  No problems with hearing or vision. No falls in last year.  EtOH - fifth/day.    Medications and allergies reviewed and updated in chart.  Past histories reviewed and updated if relevant as below. Patient Active Problem List  Diagnoses  . Morbid obesity  . DEPRESSION, MAJOR, RECURRENT, MODERATE  . OBSTRUCTIVE SLEEP APNEA  . Atrial fibrillation  . CHF  . ALLERGIC RHINITIS  . INSOMNIA  . HYPERGLYCEMIA  . PULMONARY EMBOLISM, HX OF  . Bariatric surgery status  . Healthcare maintenance  . AK (actinic keratosis)  . Hyperlipidemia  . Alcohol use  . Right shoulder pain   Past Medical History  Diagnosis Date  . Systolic and diastolic CHF, chronic 10/2011    EF 45%; mod-severe MR  . History of acute renal failure 09/2010  . OSA on CPAP     Auto, on 7 usually  . Morbid obesity     s/p gastric bypass 2008  . Allergic rhinitis   . Depression     verified-on effexor 2 times a day  . PE (pulmonary embolism) 2007    h/o PE x1- completed coumadin course  . Asthma childhood  . History of gastroesophageal reflux (GERD)     improved after bypass  . Diabetes mellitus     improved after bypass  . HLD (hyperlipidemia)     improved after bypass  . HTN  (hypertension)     improved after bypass  . Alcohol use    Past Surgical History  Procedure Date  . Cardiac catheterization 12/2006    [facility]-normal LVEF 55%, no regional wall motion abnl, No obstructive CAD noted. Valves WNL (Dr. [name])  . 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 Esophagogastroduodenoscopy 06/2006    path-benign gastric mucosa  . Doppler echocardiography 09/2010    2D-dilated LV, EF 45%, Diastolic dysfunction, Mod-severe MR, Mod pHTN  . Colonoscopy 05/2009    2 polyps, small int hemorrhoids ([place] [name]), rec rpt 5 years   History  Substance Use Topics  . Smoking status: Former Smoker    Types: Cigarettes    Quit date: 04/01/1975  . Smokeless tobacco: Never Used  . Alcohol Use: Yes     occasional   Family History  Problem Relation Age of Onset  . Emphysema Father   . Coronary artery disease Father   . Heart attack Father 75  . Heart failure Mother   . Rheumatic fever Sister   . Heart disease Sister     "heart issues"  . Alzheimer's disease Sister   . Cancer Neg Hx    Allergies  Allergen Reactions  . Statins Other (See Comments)    Muscle/joint pains (even red yeast rice)  . Sulfonamide Derivatives     REACTION:  mouth breaks out   Current Outpatient Prescriptions on File Prior to Visit  Medication Sig Dispense Refill  . amiodarone (PACERONE) 200 MG tablet Take 1 tablet (200 mg total) by mouth daily.  30 tablet  6  . aspirin 81 MG tablet Take 81 mg by mouth daily.        . Calcium Carbonate-Vitamin D (CALCIUM 600/VITAMIN D) 600-400 MG-UNIT per tablet Take 2 tablets by mouth daily.      . cetirizine (ZYRTEC) 10 MG tablet Take 10 mg by mouth daily.        . Cyanocobalamin (B-12 DOTS) 500 MCG SUBL Place 1 tablet under the tongue every morning. 1000 mcg      . digoxin (LANOXIN) 0.25 MG tablet Take 1 tablet (250 mcg total) by mouth daily.  30 tablet  6  . docusate sodium (COLACE) 100 MG capsule Take 100 mg by mouth 2 (two) times daily.        .  fish oil-omega-3 fatty acids 1000 MG capsule Take 2 g by mouth daily.       . fluticasone (FLONASE) 50 MCG/ACT nasal spray Place 2 sprays into the nose daily.  16 g  11  . gabapentin (NEURONTIN) 300 MG capsule Take 300 mg by mouth 3 (three) times daily.      . metoprolol tartrate (LOPRESSOR) 25 MG tablet Take 1 tablet (25 mg total) by mouth 2 (two) times daily.  60 tablet  6  . Multiple Vitamin (MULTIVITAMIN) tablet Take 1 tablet by mouth daily.        Marland Kitchen torsemide (DEMADEX) 20 MG tablet Take two tablets in the morning and one tablet in the evening for heart.  90 tablet  6  . venlafaxine (EFFEXOR-XR) 150 MG 24 hr capsule Take 1 capsule (150 mg total) by mouth 2 (two) times daily.  60 capsule  11  . zolpidem (AMBIEN) 10 MG tablet Take 1 tablet (10 mg total) by mouth at bedtime as needed.  30 tablet  3  . ferrous sulfate 325 (65 FE) MG tablet Take 325 mg by mouth 2 (two) times daily. 2 hours before or after calcium        Review of Systems  Constitutional: Negative for fever, chills, activity change, appetite change, fatigue and unexpected weight change.  HENT: Negative for hearing loss and neck pain.   Eyes: Negative for visual disturbance.  Respiratory: Negative for cough, chest tightness, shortness of breath and wheezing.   Cardiovascular: Negative for chest pain, palpitations and leg swelling.  Gastrointestinal: Negative for nausea, vomiting, abdominal pain, diarrhea, constipation, blood in stool and abdominal distention.  Genitourinary: Negative for hematuria and difficulty urinating.  Musculoskeletal: Negative for myalgias and arthralgias.  Skin: Negative for rash.  Neurological: Negative for dizziness, seizures, syncope and headaches.  Hematological: Does not bruise/bleed easily.  Psychiatric/Behavioral: Negative for dysphoric mood. The patient is not nervous/anxious.        Objective:   Physical Exam  Nursing note and vitals reviewed. Constitutional: He is oriented to person,  place, and time. He appears well-developed and well-nourished. No distress.  HENT:  Head: Normocephalic and atraumatic.  Right Ear: Hearing, tympanic membrane, external ear and ear canal normal.  Left Ear: Hearing, tympanic membrane, external ear and ear canal normal.  Nose: Nose normal.  Mouth/Throat: Oropharynx is clear and moist. No oropharyngeal exudate.  Eyes: Conjunctivae and EOM are normal. Pupils are equal, round, and reactive to light. No scleral icterus.  Neck: Normal range of motion. Neck supple.  No thyromegaly present.  Cardiovascular: Normal rate, regular rhythm, normal heart sounds and intact distal pulses.   No murmur heard. Pulses:      Radial pulses are 2+ on the right side, and 2+ on the left side.  Pulmonary/Chest: Effort normal and breath sounds normal. No respiratory distress. He has no wheezes. He has no rales.  Abdominal: Soft. Bowel sounds are normal. He exhibits no distension and no mass. There is no tenderness. There is no rebound and no guarding.  Genitourinary: Rectum normal and prostate normal. Rectal exam shows no external hemorrhoid, no internal hemorrhoid, no fissure, no mass, no tenderness and anal tone normal. Prostate is not enlarged (~20gm) and not tender.  Musculoskeletal: Normal range of motion. He exhibits no edema.  Lymphadenopathy:    He has no cervical adenopathy.  Neurological: He is alert and oriented to person, place, and time.       CN grossly intact, station and gait intact  Skin: Skin is warm and dry. No rash noted.  Psychiatric: He has a normal mood and affect. His behavior is normal. Judgment and thought content normal.      Assessment & Plan:

## 2012-04-30 NOTE — Patient Instructions (Addendum)
Slowly restart iron. Advanced directive information provided today. Good to see you today, call us with questions. We will fax results of blood tests to [facility] Dr. Lily Peer Keep working on cutting back on alcohol. Triglycerides were too high so I'm glad we've restarted fish oil - if not improving we will discuss starting medicine for this.

## 2012-04-30 NOTE — Assessment & Plan Note (Addendum)
I have personally reviewed the Medicare Annual Wellness questionnaire and have noted 1. The patient's medical and social history 2. Their use of alcohol, tobacco or illicit drugs 3. Their current medications and supplements 4. The patient's functional ability including ADL's, fall risks, home safety risks and hearing or visual impairment. 5. Diet and physical activity 6. Evidence for depression or mood disorders The patients weight, height, BMI have been recorded in the chart.  Hearing and vision has been addressed. I have made referrals, counseling and provided education to the patient based review of the above and I have provided the pt with a written personalized care plan for preventive services. See scanned questionairre. Advanced directives discussed: provided with information for pt to peruse  Reviewed preventative protocols and updated unless pt declined. utd on immunizations and colonoscopy DRE/PSA reassuring today. Reviewed blood work with pt.

## 2012-04-30 NOTE — Assessment & Plan Note (Signed)
Elevated triglycerides Has been off fish oil for last several months after hand accident. Encouraged decreased EtOH and restarting fish oil.  Recheck in 3-6 mo, if remaining elevated, will discuss fibrate. Intolerant to statins.

## 2012-04-30 NOTE — Assessment & Plan Note (Signed)
Reviewed blood work with pt, rec restart iron supplementation given low stores.  Will fax results of blood work to Dr. Lily Peer at [facility].

## 2012-06-05 ENCOUNTER — Telehealth: Payer: Self-pay

## 2012-06-05 MED ORDER — TORSEMIDE 20 MG PO TABS: ORAL_TABLET | ORAL | Status: DC

## 2012-06-05 NOTE — Telephone Encounter (Signed)
Refill sent for torsemide 20 mg  

## 2012-07-10 ENCOUNTER — Other Ambulatory Visit: Payer: Self-pay | Admitting: *Deleted

## 2012-07-10 MED ORDER — AMIODARONE HCL 200 MG PO TABS: 200.0000 mg | ORAL_TABLET | Freq: Every day | ORAL | Status: DC

## 2012-07-10 NOTE — Telephone Encounter (Signed)
Refilled Amiodarone. 

## 2012-07-18 ENCOUNTER — Ambulatory Visit (INDEPENDENT_AMBULATORY_CARE_PROVIDER_SITE_OTHER): Payer: Medicare Other | Admitting: Family Medicine

## 2012-07-18 ENCOUNTER — Encounter: Payer: Self-pay | Admitting: Family Medicine

## 2012-07-18 VITALS — BP 124/82 | HR 80 | Temp 98.0°F | Wt 307.2 lb

## 2012-07-18 DIAGNOSIS — R21 Rash and other nonspecific skin eruption: Secondary | ICD-10-CM

## 2012-07-18 MED ORDER — HYDROXYZINE HCL 25 MG PO TABS: 25.0000 mg | ORAL_TABLET | Freq: Three times a day (TID) | ORAL | Status: AC | PRN

## 2012-07-18 MED ORDER — PREDNISONE 20 MG PO TABS: ORAL_TABLET | ORAL | Status: DC

## 2012-07-18 NOTE — Progress Notes (Signed)
Subjective:    Patient ID: Jordan Mcgrath, male    DOB: 21-Feb-1941, 71 y.o.   MRN: [id number]  HPI CC: skin rash  1 wk ago started with skin rash.  Started on scalp, back of neck, and top of feet.  Has spread since.  Had some chills late yesterday afternoon.  Spots are very itchy.  Tried benadryl, takes claritin daily.  Stopped eating fresh tomatoes but no change.  No new medicines, lotions, detergents, soaps, shampoos.  No oral lesions.  No fevers, abd pain, n/v, joint pains.  No recent bug bites or tick bites.  No cough, SOB, sneezing, congestion, URTI sxs.  No lip or tongue swelling.  H/o hives to horses in past.  When travels outside in heat, skin gets worse.  Spots staying in same locations.  Past Medical History  Diagnosis Date  . Systolic and diastolic CHF, chronic 10/2011    EF 45%; mod-severe MR  . History of acute renal failure 09/2010  . OSA on CPAP     Auto, on 7 usually  . Morbid obesity     s/p gastric bypass 2008  . Allergic rhinitis   . Depression     verified-on effexor 2 times a day  . PE (pulmonary embolism) 2007    h/o PE x1- completed coumadin course  . Asthma childhood  . History of gastroesophageal reflux (GERD)     improved after bypass  . Diabetes mellitus     improved after bypass  . HLD (hyperlipidemia)     improved after bypass  . HTN (hypertension)     improved after bypass  . Alcohol use   . Atrial fibrillation 10/30/2010    hx    Current Outpatient Prescriptions on File Prior to Visit  Medication Sig Dispense Refill  . amiodarone (PACERONE) 200 MG tablet Take 1 tablet (200 mg total) by mouth daily.  30 tablet  6  . aspirin 81 MG tablet Take 81 mg by mouth daily.        . Calcium Carbonate-Vitamin D (CALCIUM 600/VITAMIN D) 600-400 MG-UNIT per tablet Take 2 tablets by mouth daily.      . cetirizine (ZYRTEC) 10 MG tablet Take 10 mg by mouth daily.        . Cyanocobalamin (B-12 DOTS) 500 MCG SUBL Place 1 tablet under the tongue every morning. 5000  mcg      . digoxin (LANOXIN) 0.25 MG tablet Take 1 tablet (250 mcg total) by mouth daily.  30 tablet  6  . docusate sodium (COLACE) 100 MG capsule Take 100 mg by mouth 2 (two) times daily.        . ferrous sulfate 325 (65 FE) MG tablet Take 325 mg by mouth 2 (two) times daily. 2 hours before or after calcium      . fish oil-omega-3 fatty acids 1000 MG capsule Take 2 g by mouth daily.       . fluticasone (FLONASE) 50 MCG/ACT nasal spray Place 2 sprays into the nose daily.  16 g  11  . gabapentin (NEURONTIN) 300 MG capsule Take 300 mg by mouth 3 (three) times daily.      . metoprolol tartrate (LOPRESSOR) 25 MG tablet Take 1 tablet (25 mg total) by mouth 2 (two) times daily.  60 tablet  6  . Multiple Vitamin (MULTIVITAMIN) tablet Take 1 tablet by mouth daily.        Marland Kitchen torsemide (DEMADEX) 20 MG tablet Take two tablets in the morning and one  tablet in the evening for heart.  90 tablet  6  . venlafaxine (EFFEXOR-XR) 150 MG 24 hr capsule Take 1 capsule (150 mg total) by mouth 2 (two) times daily.  60 capsule  11  . zolpidem (AMBIEN) 10 MG tablet Take 1 tablet (10 mg total) by mouth at bedtime as needed.  30 tablet  3   Review of Systems Per HPI    Objective:   Physical Exam Obese CM NAD Diffuse urticarial pruritic rash throughout body, entire trunk, abdomen, back, arms and legs.  Leg lesions with hypopigmented ring around them.  Some dermatographia present as some in linear distribution after scratch of skin.  Lesions blanche No oral lesions No lip or tongue swelling    Assessment & Plan:

## 2012-07-18 NOTE — Patient Instructions (Signed)
Good to see you today. Call me next week with an update. Stop benadryl. Use atarax (hydroxyzine) for itch, may add zantac or pepcid. Take course of steroids as prescribed. I will touch base with Dr. Mariah Milling. If not improving into next week, we will refer you to dermatologist. If any worsening, or oral lesions or swelling of lips, please seek urgent care over weekend.

## 2012-07-18 NOTE — Assessment & Plan Note (Addendum)
anticipate drug sensitivity reaction but unclear offending agent.  EM-like picture vs urticarial rash, less likely as skin lesions not migrating. Regardless given pruritis, will treat with atarax instead of benadryl and concommitant use of H2 blocker. Will also treat with short course of prednisone. Discussed if spreading, fevers, or oral involvement, to ER for eval. Reviewing meds, several can cause EM/SJS including amiodarone, torsemide, digoxin, effexor, and gabapentin.  Most likely would be amiodarone and torsemide.  Will touch base with cards re trial off one of these meds if not resolved with steroids. To call us next week with update, if not better will likely refer to derm vs consider skin biopsy.

## 2012-07-22 ENCOUNTER — Ambulatory Visit (INDEPENDENT_AMBULATORY_CARE_PROVIDER_SITE_OTHER): Payer: Medicare Other | Admitting: Family Medicine

## 2012-07-22 ENCOUNTER — Encounter: Payer: Self-pay | Admitting: Family Medicine

## 2012-07-22 VITALS — BP 130/80 | HR 88 | Temp 97.8°F | Ht 72.0 in | Wt 307.0 lb

## 2012-07-22 DIAGNOSIS — R21 Rash and other nonspecific skin eruption: Secondary | ICD-10-CM

## 2012-07-22 NOTE — Patient Instructions (Signed)
Avoid tomatoes. Continue steroid for now. Pass by Marion's office to see if we can expedite dermatology referral.

## 2012-07-22 NOTE — Progress Notes (Signed)
  Subjective:    Patient ID: Jordan Mcgrath, male    DOB: [birth year], 71 y.o.   MRN: [id number]  HPI CC: f/u skin rash  See prior note for details.  Seen last week 07/18/2012, concern for drug induced EM.  Placed on prednisone taper of 40mg  for 5 days followed by 20mg  for 5 days, as well as atarax.  Yesterday was last day of 40mg  prednisone which he took in am.  Skin rash had nearly resolved.  Then last night started worsening itching, this morning when awoke, had significant periorbital swelling, lip burning and swelling, and mild sore throat, and headache.    Only different thing was he again did eat hamburger with a few slices of tomatoe.  No nausea, fevers, throat swelling.  Review of Systems Per HPI    Objective:   Physical Exam  Nursing note and vitals reviewed. Constitutional: He appears well-developed and well-nourished.       obese  HENT:       Posterior oropharyngeal cobblestoning Periorbital swelling. Some lip swelling. EOMI intact without pain.  Cardiovascular: Normal rate, regular rhythm, normal heart sounds and intact distal pulses.   Pulmonary/Chest: Effort normal and breath sounds normal. No respiratory distress. He has no wheezes. He has no rales.  Musculoskeletal: He exhibits no edema.  Skin: Skin is warm. Rash noted.       Diffuse erythematous raised rash throughout body, different sizes, on trunk, abdomen, back, arms and mildly on legs.  Mostly spares palms, soles.  Less rash than last week.  Leg and arm lesions with hypopigmented ring around them.  Some dermatographia present.  Lesions blanche. Hyperpigmented macules at site of prior rash last week.  Psychiatric: He has a normal mood and affect.      Assessment & Plan:  I spoke with cards - rec against changing diuretic, could possibly switch from amio to flecainide. Will first get derm opinion.

## 2012-07-22 NOTE — Assessment & Plan Note (Signed)
Deteriorated on 40mg  prednisone, only change was again ate a few slices of tomatoes.  Somewhat strange new development of tomato allergy, but less likely to be drug allergy given story (worsened after initially improved on same dose of steroids.) Will refer to derm for evaluation, see if they think EM and for eval of likely causes of this. No changes to meds today. Continue steroid at 20mg  daily.

## 2012-07-23 ENCOUNTER — Telehealth: Payer: Self-pay | Admitting: Cardiovascular Disease

## 2012-07-23 NOTE — Telephone Encounter (Signed)
Called patient to schedule appointment this week with Dr. Mariah Milling, needs to be seen for medication that might be causing a rash per Dr. Mariah Milling.  Left message for patient to call office back to schedule appointment

## 2012-07-30 ENCOUNTER — Telehealth: Payer: Self-pay | Admitting: Family Medicine

## 2012-07-30 NOTE — Telephone Encounter (Signed)
Can we call pt for an update on skin rash and derm recommendations?

## 2012-07-30 NOTE — Telephone Encounter (Addendum)
Ok thanks.  Will monitor for now.

## 2012-07-30 NOTE — Telephone Encounter (Signed)
Spoke with patient and he said derm really didn't have an explanation. He put him on a 30 day regimen of steroids. Patient thinks it was coming from the Ambien because it seemed to flare at night after taking it and get better during the day. He has stopped taking it and hasn't had anymore issues. He's afraid to try it again to see if indeed that was the cause. I told him I would add it to his allergy list and he was agreeable. He will call with any problems. Allergy list updated and ambien removed from med list.

## 2012-08-04 ENCOUNTER — Other Ambulatory Visit: Payer: Self-pay | Admitting: *Deleted

## 2012-08-04 MED ORDER — CETIRIZINE HCL 10 MG PO TABS: 10.0000 mg | ORAL_TABLET | Freq: Every day | ORAL | Status: DC

## 2012-08-04 NOTE — Telephone Encounter (Signed)
Received faxed refill request from pharmacy. Refill sent to pharmacy electronically. 

## 2012-08-11 ENCOUNTER — Other Ambulatory Visit: Payer: Self-pay | Admitting: *Deleted

## 2012-08-11 MED ORDER — VENLAFAXINE HCL ER 150 MG PO CP24: 150.0000 mg | ORAL_CAPSULE | Freq: Two times a day (BID) | ORAL | Status: DC

## 2012-08-18 ENCOUNTER — Other Ambulatory Visit: Payer: Self-pay | Admitting: *Deleted

## 2012-08-18 NOTE — Telephone Encounter (Signed)
Phamacy requesting refill on ambien but this medication is on allergy list is it okay to refill?

## 2012-08-18 NOTE — Telephone Encounter (Signed)
No , let's stop this (see last phone note). Sent denial to pharmacy.

## 2012-08-20 ENCOUNTER — Other Ambulatory Visit: Payer: Self-pay | Admitting: *Deleted

## 2012-08-20 NOTE — Telephone Encounter (Signed)
Advised pharmacy tech over the phone and faxed refill request back, denied.

## 2012-08-20 NOTE — Telephone Encounter (Signed)
plz fax back with denial, reason new allergy to ambien.

## 2012-08-20 NOTE — Telephone Encounter (Signed)
Faxed refill request from [facility] pharmacy for zolpidem 10 mg's.  Last filled 07/12/12.  This is no longer on med list.

## 2012-09-02 ENCOUNTER — Other Ambulatory Visit: Payer: Self-pay | Admitting: *Deleted

## 2012-09-02 MED ORDER — DIGOXIN 250 MCG PO TABS: 250.0000 ug | ORAL_TABLET | Freq: Every day | ORAL | Status: DC

## 2012-09-02 MED ORDER — METOPROLOL TARTRATE 25 MG PO TABS: 25.0000 mg | ORAL_TABLET | Freq: Two times a day (BID) | ORAL | Status: DC

## 2012-09-02 NOTE — Telephone Encounter (Signed)
Refilled Metoprolol and Digoxin.

## 2012-09-08 ENCOUNTER — Telehealth: Payer: Self-pay | Admitting: Family Medicine

## 2012-09-08 NOTE — Telephone Encounter (Signed)
Wife/Sharon calling on 09/08/12  states pt is requesting a refill on his sleep medication (pt does not know the name)/ pharmacy [facility] [phone]/pharmacy has called office. Pt has not had medication for "several weeks" Wife states he does not have an allergy to his sleep medication as previously noted.

## 2012-09-09 ENCOUNTER — Telehealth: Payer: Self-pay | Admitting: Family Medicine

## 2012-09-09 MED ORDER — ZOLPIDEM TARTRATE 10 MG PO TABS: 10.0000 mg | ORAL_TABLET | Freq: Every evening | ORAL | Status: DC | PRN

## 2012-09-09 NOTE — Telephone Encounter (Signed)
Spoke with patient.  He was very upset and states he has been trying to reach our office since Friday afternoon.  I see a note in the system from yesterday at 5:09pm taken by Call A Nurse, but this message was never sent to me.  Have routed to [facility] to check with call a nurse about this and why not sent to me. There was concern about ambien causing EM - but actually pt restarted Remus Loffler and has not had any more rash since then.  Have removed ambien from list. Has been on ambien in past, tolerated very well. I feel comfortable refilling this medication.  Have phoned into pharmacy and notified pt should be ready for him. He expressed appreciation at phone call.

## 2012-09-09 NOTE — Telephone Encounter (Signed)
Caller: Jordan Mcgrath/Patient is calling with a question about sleeping medication.  Second call in 24 hours to request Rx.  States had hives several weeks ago, and was seen in office 07/22/12.  During that time of trying to find the source of the hives, he stopped the sleeping medication.  Has resumed the sleeping medication, and when the bottle was empty, he was unable to get a renewal "because it was listed as allergy to medication."  States happy to find a different medication if Dr. Sharen Hones wants to try something other than Ambien.  Last well visit 05/13.  Patient is very frustrated that his allergy list includes Remus Loffler so he cannot seem to get a refill.  Patient is very angry; refuses new triage; willing to come in for appointment if necessary, but prefers to have Rx called in.   Info to office for provider review/Rx/callback. May reach patient [phone].

## 2012-10-01 ENCOUNTER — Ambulatory Visit: Payer: Medicare Other | Admitting: Family Medicine

## 2012-10-15 ENCOUNTER — Ambulatory Visit (INDEPENDENT_AMBULATORY_CARE_PROVIDER_SITE_OTHER): Payer: Medicare Other | Admitting: Family Medicine

## 2012-10-15 ENCOUNTER — Encounter: Payer: Self-pay | Admitting: Family Medicine

## 2012-10-15 VITALS — BP 128/80 | HR 80 | Temp 98.1°F | Wt 314.2 lb

## 2012-10-15 DIAGNOSIS — Z23 Encounter for immunization: Secondary | ICD-10-CM

## 2012-10-15 DIAGNOSIS — I509 Heart failure, unspecified: Secondary | ICD-10-CM

## 2012-10-15 DIAGNOSIS — E785 Hyperlipidemia, unspecified: Secondary | ICD-10-CM

## 2012-10-15 DIAGNOSIS — I4891 Unspecified atrial fibrillation: Secondary | ICD-10-CM

## 2012-10-15 DIAGNOSIS — Z9884 Bariatric surgery status: Secondary | ICD-10-CM

## 2012-10-15 DIAGNOSIS — I5042 Chronic combined systolic (congestive) and diastolic (congestive) heart failure: Secondary | ICD-10-CM

## 2012-10-15 DIAGNOSIS — D696 Thrombocytopenia, unspecified: Secondary | ICD-10-CM

## 2012-10-15 DIAGNOSIS — F1010 Alcohol abuse, uncomplicated: Secondary | ICD-10-CM

## 2012-10-15 DIAGNOSIS — Z7289 Other problems related to lifestyle: Secondary | ICD-10-CM

## 2012-10-15 NOTE — Assessment & Plan Note (Signed)
Elevated trig - due for recheck but not fasting.  Recommended return fasting for this. On fish oil. Consider fibrate.  Intolerant to statins.

## 2012-10-15 NOTE — Assessment & Plan Note (Signed)
Stable, continue meds 

## 2012-10-15 NOTE — Assessment & Plan Note (Signed)
Continue supplements

## 2012-10-15 NOTE — Addendum Note (Signed)
Addended by: [name] on: 10/15/2012 08:32 AM   Modules accepted: Orders

## 2012-10-15 NOTE — Assessment & Plan Note (Addendum)
Mild, asxs.  May be aspirin related. In h/o EtOH use, discussed concern with this finding. Check periph smear, and recheck cbc when returns fasting for blood work.

## 2012-10-15 NOTE — Assessment & Plan Note (Signed)
Encouraged continued control.

## 2012-10-15 NOTE — Assessment & Plan Note (Signed)
Wt Readings from Last 3 Encounters:  10/15/12 314 lb 4 oz (142.543 kg)  07/22/12 307 lb (139.254 kg)  07/18/12 307 lb 4 oz (139.368 kg)  weight gain noted.

## 2012-10-15 NOTE — Assessment & Plan Note (Signed)
Regular today

## 2012-10-15 NOTE — Progress Notes (Signed)
  Subjective:    Patient ID: Jordan Mcgrath, male    DOB: 1941-09-11, 71 y.o.   MRN: [id number]  HPI CC: 5 mo f/u  Pleasant 71 yo with h/o OSA on nightly CPAP, obesity s/p gastric bypass 2008, allergic rhinitis, chronic combined systolic and diastolic CHF, pulm HTN, asthma, h/o afib.  Complaint with meds.  Off gabapentin (prior on this for hands)  Started iron bid.  S/p gastric bypass.  Hypertriglyceridemia - due for recheck but not fasting today.  On fish oil 2 gm daily.  Intolerant to statins in past.  EtOH - 4-5 drinks max per day.  Has cut back.  H/o thrombocytopenia, stable.  No transaminitis last check.  Mildly low platelets - denies easy bruising/bleeding.    Denies CP/tightness, SOB, coughing or wheezing, palpitations, HA, dizziness.  Flu shot today.  Past Medical History  Diagnosis Date  . Systolic and diastolic CHF, chronic 10/2011    EF 45%; mod-severe MR  . History of acute renal failure 09/2010  . OSA on CPAP     Auto, on 7 usually  . Morbid obesity     s/p gastric bypass 2008  . Allergic rhinitis   . Depression     verified-on effexor 2 times a day  . PE (pulmonary embolism) 2007    h/o PE x1- completed coumadin course  . Asthma childhood  . History of gastroesophageal reflux (GERD)     improved after bypass  . Diabetes mellitus     improved after bypass  . HLD (hyperlipidemia)     improved after bypass  . HTN (hypertension)     improved after bypass  . Alcohol use   . Atrial fibrillation 10/30/2010    hx     Review of Systems Per HPI    Objective:   Physical Exam  Nursing note and vitals reviewed. Constitutional: He appears well-developed and well-nourished. No distress.       obese  HENT:  Head: Normocephalic and atraumatic.  Mouth/Throat: Oropharynx is clear and moist. No oropharyngeal exudate.  Eyes: Conjunctivae normal and EOM are normal. Pupils are equal, round, and reactive to light. No scleral icterus.  Neck: Normal range of motion. Neck  supple.  Cardiovascular: Normal rate, regular rhythm, normal heart sounds and intact distal pulses.   No murmur heard. Pulmonary/Chest: Effort normal and breath sounds normal. No respiratory distress. He has no wheezes. He has no rales.  Musculoskeletal: He exhibits no edema.  Lymphadenopathy:    He has no cervical adenopathy.  Skin: Skin is warm and dry. No rash noted.       No AKs on face       Assessment & Plan:

## 2012-10-15 NOTE — Patient Instructions (Signed)
Flu shot today. Return at your convenience fasting for blood work to check cholesterol and recheck platelets. Return in 7 months for next medicare wellness visit, prior fasting for blood work. Return sooner as needed. Good to see you today, call us with questions.

## 2012-11-19 ENCOUNTER — Other Ambulatory Visit (INDEPENDENT_AMBULATORY_CARE_PROVIDER_SITE_OTHER): Payer: Medicare Other

## 2012-11-19 ENCOUNTER — Ambulatory Visit (INDEPENDENT_AMBULATORY_CARE_PROVIDER_SITE_OTHER): Payer: Managed Care, Other (non HMO) | Admitting: Family Medicine

## 2012-11-19 ENCOUNTER — Encounter: Payer: Self-pay | Admitting: Family Medicine

## 2012-11-19 VITALS — BP 130/80 | HR 80 | Temp 97.9°F | Wt 317.8 lb

## 2012-11-19 DIAGNOSIS — R109 Unspecified abdominal pain: Secondary | ICD-10-CM

## 2012-11-19 DIAGNOSIS — D696 Thrombocytopenia, unspecified: Secondary | ICD-10-CM

## 2012-11-19 DIAGNOSIS — K922 Gastrointestinal hemorrhage, unspecified: Secondary | ICD-10-CM | POA: Insufficient documentation

## 2012-11-19 DIAGNOSIS — E785 Hyperlipidemia, unspecified: Secondary | ICD-10-CM

## 2012-11-19 LAB — COMPREHENSIVE METABOLIC PANEL
ALT: 36 U/L (ref 0–53)
Albumin: 3.8 g/dL (ref 3.5–5.2)
BUN: 14 mg/dL (ref 6–23)
CO2: 31 mEq/L (ref 19–32)
Calcium: 8.7 mg/dL (ref 8.4–10.5)
Chloride: 99 mEq/L (ref 96–112)
Creatinine, Ser: 0.9 mg/dL (ref 0.4–1.5)
GFR: 87.72 mL/min (ref >60.00)
Potassium: 3.4 mEq/L — ABNORMAL LOW (ref 3.5–5.1)

## 2012-11-19 LAB — CBC WITH DIFFERENTIAL/PLATELET
Basophils Relative: 0.7 % (ref 0.0–3.0)
Eosinophils Relative: 5 % (ref 0.0–5.0)
HCT: 44.4 % (ref 39.0–52.0)
Hemoglobin: 15.1 g/dL (ref 13.0–17.0)
Lymphs Abs: 1.7 10*3/uL (ref 0.7–4.0)
MCV: 98.5 fl (ref 78.0–100.0)
Monocytes Absolute: 0.8 10*3/uL (ref 0.1–1.0)
Monocytes Relative: 10.5 % (ref 3.0–12.0)
RBC: 4.51 Mil/uL (ref 4.22–5.81)
WBC: 7.5 10*3/uL (ref 4.5–10.5)

## 2012-11-19 LAB — LDL CHOLESTEROL, DIRECT: Direct LDL: 106.2 mg/dL

## 2012-11-19 NOTE — Patient Instructions (Addendum)
I'm going to add some tests to blood drawn today. Pass by Marion's office to schedule ultrasound of abdomen.

## 2012-11-19 NOTE — Assessment & Plan Note (Signed)
Lungs clear today. Anticipate MSK pain vs abdominal in etiology.   Previous concern for liver damage from EtOH - check abd [place]. Will add on CMP and CPK to blood work drawn today (CBC, FLP, periph smear). If [place] unrevealing, consider starting flexeril.

## 2012-11-19 NOTE — Progress Notes (Signed)
  Subjective:    Patient ID: Jordan Mcgrath, male    DOB: 12/18/1941, 71 y.o.   MRN: [id number]  HPI CC: pain in sides  Pleasant 71 yo with h/o OSA on nightly CPAP, obesity s/p gastric bypass 2008, allergic rhinitis, chronic combined systolic and diastolic CHF, pulm HTN, asthma, h/o afib.  3 wk h/o pain in sides.  Points bilateral abdominal sides/latera ribcage walls.  No reproducible pain.  Very positional pain.  Worse with jarring.  Sitting and standing hurts.  Pain alleviated when in bed.  Pain described as ache and soreness.    Denies inciting trauma/falls. No recent changes in meds. Hasn't tried any medicines for this.  No fevers/chills, nausea/vomiting, abd pain, diarrhea or constipation.  No cough/SOB.  Normal stools - light color, formed and soft, about 1/day.  H/o gastric bypass.  No dysuria, urgency/frequency.  Continues drinking 1 pint/day.  Past Medical History  Diagnosis Date  . Systolic and diastolic CHF, chronic 10/2011    EF 45%; mod-severe MR  . History of acute renal failure 09/2010  . OSA on CPAP     Auto, on 7 usually  . Morbid obesity     s/p gastric bypass 2008  . Allergic rhinitis   . Depression     verified-on effexor 2 times a day  . PE (pulmonary embolism) 2007    h/o PE x1- completed coumadin course  . Asthma childhood  . History of gastroesophageal reflux (GERD)     improved after bypass  . Diabetes mellitus     improved after bypass  . HLD (hyperlipidemia)     improved after bypass  . HTN (hypertension)     improved after bypass  . Alcohol use   . Atrial fibrillation 10/30/2010    hx     Review of Systems Per HPI    Objective:   Physical Exam  Nursing note and vitals reviewed. Constitutional: He appears well-developed and well-nourished. No distress.  HENT:  Mouth/Throat: Oropharynx is clear and moist. No oropharyngeal exudate.  Neck: Normal range of motion. Neck supple.  Cardiovascular: Normal rate, regular rhythm, normal heart sounds  and intact distal pulses.   No murmur heard. Pulmonary/Chest: Effort normal and breath sounds normal. No respiratory distress. He has no wheezes. He has no rales.  Abdominal: Soft. Bowel sounds are normal. He exhibits no distension and no mass. There is hepatomegaly (mild). There is no splenomegaly. There is no tenderness. There is no rebound, no guarding and no CVA tenderness.       Obese No reproducible side pain on palpation today.  Musculoskeletal: He exhibits no edema.  Skin: Skin is warm and dry. No rash noted.  Psychiatric: He has a normal mood and affect.       Assessment & Plan:

## 2012-11-20 ENCOUNTER — Ambulatory Visit: Payer: Self-pay | Admitting: Family Medicine

## 2012-11-20 ENCOUNTER — Encounter: Payer: Self-pay | Admitting: Family Medicine

## 2012-11-21 ENCOUNTER — Other Ambulatory Visit: Payer: Self-pay | Admitting: Family Medicine

## 2012-11-21 MED ORDER — CYCLOBENZAPRINE HCL 10 MG PO TABS: 10.0000 mg | ORAL_TABLET | Freq: Two times a day (BID) | ORAL | Status: DC | PRN

## 2012-11-21 MED ORDER — FENOFIBRATE 145 MG PO TABS: 145.0000 mg | ORAL_TABLET | Freq: Every day | ORAL | Status: DC

## 2012-11-30 HISTORY — PX: ESOPHAGOGASTRODUODENOSCOPY: SHX1529

## 2012-12-02 ENCOUNTER — Telehealth: Payer: Self-pay

## 2012-12-02 NOTE — Telephone Encounter (Addendum)
Pt said had gastric bypass and stool usually light beige colored. Since 12/01/12 at 3:30 am had 4 black tarry stools.No constipation or diarrhea. Pt having upper abdominal pain that is constant and dull; started yesterday morning constantly.pain level now 7.Pt seen 11/19/12. Abdominal pain stops if pt is laying in bed. No nausea or vomiting. Dr Sharen Hones said if pt feels OK and not having h/a,SOB or dizziness schedule pt 12/03/12 at 9:30 am to see Dr Reece Agar. Pt had no h/a dizziness or SOB and stated he was OK. Advised pt if condition changed or worsened to go to UC or ER. Pt voiced understanding.

## 2012-12-02 NOTE — Telephone Encounter (Signed)
Will see tomorrow

## 2012-12-03 ENCOUNTER — Ambulatory Visit (INDEPENDENT_AMBULATORY_CARE_PROVIDER_SITE_OTHER): Payer: Managed Care, Other (non HMO) | Admitting: Family Medicine

## 2012-12-03 ENCOUNTER — Other Ambulatory Visit: Payer: Self-pay | Admitting: *Deleted

## 2012-12-03 ENCOUNTER — Encounter: Payer: Self-pay | Admitting: Family Medicine

## 2012-12-03 VITALS — BP 130/72 | HR 60 | Temp 97.6°F | Ht 72.0 in | Wt 315.2 lb

## 2012-12-03 DIAGNOSIS — K922 Gastrointestinal hemorrhage, unspecified: Secondary | ICD-10-CM

## 2012-12-03 LAB — CBC WITH DIFFERENTIAL/PLATELET
Basophils Absolute: 0.1 10*3/uL (ref 0.0–0.1)
Eosinophils Absolute: 0.4 10*3/uL (ref 0.0–0.7)
Lymphocytes Relative: 19.8 % (ref 12.0–46.0)
MCHC: 33.3 g/dL (ref 30.0–36.0)
Monocytes Absolute: 0.8 10*3/uL (ref 0.1–1.0)
Neutrophils Relative %: 62.2 % (ref 43.0–77.0)
Platelets: 148 10*3/uL — ABNORMAL LOW (ref 150.0–400.0)
RBC: 4.33 Mil/uL (ref 4.22–5.81)
RDW: 13 % (ref 11.5–14.6)

## 2012-12-03 LAB — POC HEMOCCULT BLD/STL (OFFICE/1-CARD/DIAGNOSTIC): Fecal Occult Blood, POC: POSITIVE

## 2012-12-03 MED ORDER — OMEPRAZOLE 40 MG PO CPDR: 40.0000 mg | DELAYED_RELEASE_CAPSULE | Freq: Every day | ORAL | Status: DC

## 2012-12-03 NOTE — Assessment & Plan Note (Signed)
With continued nonspecific abdominal side pain.  Now black tarry stools and faintly positive hemoccult - concern for upper GI bleed, ?stomach bleed vs esoph varices given EtOH hx. Discussed implications of this, will refer to GI for further evaluation, try to expedite referral.  Discussed red flags to seek urgent care at ER. Start omeprazole, continue to avoid EtOH. Doubt Fe related as has been on this long term. Reviewed recent blood work and [place] results - mild hepatomegaly with concern for diffuse fatty infiltration.

## 2012-12-03 NOTE — Addendum Note (Signed)
Addended by: [name] on: 12/03/2012 12:54 PM   Modules accepted: Orders

## 2012-12-03 NOTE — Patient Instructions (Addendum)
Stop flexeril. I am worried about a bleed coming from your upper GI tract. Blood work today.  Start omeprazole 40mg  daily.  Pass by Marion's office for referral to GI doctor [name] at [facility]) Continue to abstain from alcohol. If at any point more frequent black stools, or nausea/vomiting, or shortness of breath or dizziness, please seek urgent care at ER.

## 2012-12-03 NOTE — Progress Notes (Signed)
Subjective:    Patient ID: Jordan Mcgrath, male    DOB: 07/16/1941, 71 y.o.   MRN: [id number]  HPI CC: f/u abd pain, new black stools  Pleasant 71 yo with h/o OSA on nightly CPAP, obesity s/p gastric bypass 2008, allergic rhinitis, chronic combined systolic and diastolic CHF, pulm HTN, asthma, h/o afib, continued EtOH abuse.  Seen here 11/19/2012 with 3 wk h/o positional pain in sides relieved when supine.  Workup included abd US showing mildly enlarged liver with fatty infiltration, gallstones.  Blood work returned showing Cr 0.9, normal LFTs and CBC with plt count of 135k.  Also showing triglycerides elevated at 499.  Given positional nature of pain, started on flexeril, and started on fenofibrate for triglycerides.  Flexeril didn't help.  Now with new development of blackish green stools that started Monday night around 3am, some diarrhea this morning.  Going twice daily.   Pain staying more constant on both sides, now more reproducible with pressure on sides.  Is on ferrous sulfate bid since 2008 after gastric bypass.  Is on Aspirin 81 mg daily.  Not taking any NSAIDs. Has cut back on EtOH.  Prior drinking fifth/day.  In last week has cut down to 2-4 oz of liquor daily, last 2 days no EtOH.  No withdrawal sxs endorsed. Not on any PPI.  No GERD sxs since bypass.  Colonoscopy 05/2009   2 polyps, small int hemorrhoids ([facility] [name]), rec rpt 5 years   Esophagogastroduodenoscopy 06/2006   path-benign gastric mucosa    Past Medical History  Diagnosis Date  . Systolic and diastolic CHF, chronic 10/2011    EF 45%; mod-severe MR  . History of acute renal failure 09/2010  . OSA on CPAP     Auto, on 7 usually  . Morbid obesity     s/p gastric bypass 2008  . Allergic rhinitis   . Depression     verified-on effexor 2 times a day  . PE (pulmonary embolism) 2007    h/o PE x1- completed coumadin course  . Asthma childhood  . History of gastroesophageal reflux (GERD)     improved after  bypass  . Diabetes mellitus     improved after bypass  . HLD (hyperlipidemia)     improved after bypass  . HTN (hypertension)     improved after bypass  . Alcohol use   . Atrial fibrillation 10/30/2010    hx    Past Surgical History  Procedure Date  . Cardiac catheterization 12/2006    [facility]-normal LVEF 55%, no regional wall motion abnl, No obstructive CAD noted. Valves WNL (Dr. [name])  . 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 Esophagogastroduodenoscopy 06/2006    path-benign gastric mucosa  . Doppler echocardiography 09/2010    2D-dilated LV, EF 45%, Diastolic dysfunction, Mod-severe MR, Mod pHTN  . Colonoscopy 05/2009    2 polyps, small int hemorrhoids ([facility] [name]), rec rpt 5 years  . 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 Marital Status: Married    Spouse Name: N/A    Number of Children: 2  . Years of Education: N/A   Occupational History  . 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 Smoking status: Former Smoker    Types: Cigarettes    Quit date: 04/01/1975  . Smokeless tobacco: Never Used  . Alcohol Use: Yes     Comment: fifth/day  . Drug Use: No  . Sexually Active: Not on file   Other Topics Concern  . 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  Has 1 daughter, 1 son.3 dogs - boston terrier, newfoundland, pekingeseCollege educated     Review of Systems Per HPI    Objective:   Physical Exam  Nursing note and vitals reviewed. Constitutional: He  appears well-developed and well-nourished. No distress.       obese  HENT:  Mouth/Throat: Oropharynx is clear and moist. No oropharyngeal exudate.  Abdominal: Soft. Bowel sounds are normal. He exhibits no distension and no mass. There is hepatomegaly (mild). There is no splenomegaly. There is no tenderness. There is no rebound, no guarding and no CVA tenderness.       Pain not reproducible  Musculoskeletal: He exhibits no edema.  Skin: Skin is warm and dry. No rash noted.  Psychiatric: He has a normal mood and affect.       Assessment & Plan:

## 2012-12-09 ENCOUNTER — Ambulatory Visit: Payer: Self-pay | Admitting: Unknown Physician Specialty

## 2012-12-09 LAB — BASIC METABOLIC PANEL
Calcium, Total: 8.5 mg/dL (ref 8.5–10.1)
Chloride: 104 mmol/L (ref 98–107)
Co2: 33 mmol/L — ABNORMAL HIGH (ref 21–32)
Creatinine: 1.03 mg/dL (ref 0.60–1.30)
EGFR (African American): >60
EGFR (Non-African Amer.): >60
Potassium: 4 mmol/L (ref 3.5–5.1)
Sodium: 140 mmol/L (ref 136–145)

## 2012-12-15 ENCOUNTER — Ambulatory Visit: Payer: Self-pay | Admitting: Unknown Physician Specialty

## 2012-12-18 ENCOUNTER — Telehealth: Payer: Self-pay | Admitting: Family Medicine

## 2012-12-19 ENCOUNTER — Encounter: Payer: Self-pay | Admitting: Family Medicine

## 2012-12-19 ENCOUNTER — Ambulatory Visit (INDEPENDENT_AMBULATORY_CARE_PROVIDER_SITE_OTHER): Payer: Managed Care, Other (non HMO) | Admitting: Family Medicine

## 2012-12-19 VITALS — BP 134/82 | HR 72 | Temp 97.7°F | Wt 313.2 lb

## 2012-12-19 DIAGNOSIS — N289 Disorder of kidney and ureter, unspecified: Secondary | ICD-10-CM

## 2012-12-19 DIAGNOSIS — N2889 Other specified disorders of kidney and ureter: Secondary | ICD-10-CM

## 2012-12-19 LAB — POCT URINALYSIS DIPSTICK
Ketones, UA: NEGATIVE
Leukocytes, UA: NEGATIVE
Protein, UA: NEGATIVE
Urobilinogen, UA: 0.2
pH, UA: 6

## 2012-12-19 MED ORDER — OMEPRAZOLE 40 MG PO CPDR: 40.0000 mg | DELAYED_RELEASE_CAPSULE | Freq: Two times a day (BID) | ORAL | Status: DC

## 2012-12-19 NOTE — Progress Notes (Signed)
Subjective:    Patient ID: Jordan Mcgrath, male    DOB: [birth year], 71 y.o.   MRN: [id number]  HPI CC: discuss referral to urologist for R kidney mass  Recently evaluated by GI for concern with upper GI bleed, EGD showing gastritis but normal duodenum and esophagus.  Obtained CT scan of abd to further eval sxs including abd pain - returned showing R kidney tumor.  I don't have records of CT scan.  GI has scheduled pt for   Recent US 10/2012 - normal kidneys, no evidence of mass or cyst.  Continued bilateral abd pain on bilateral sides.    Denies fevers/chills, blood in urine, urinary frequency or urgency, dysuria.  Able to obtain CT scan report of recent abd CT done 12/15/2012 - exophytic 2.3cm hypodensity at lower pole of R kidney - indeterminate lesion.  Former remote smoker (quit 1976).  Medications and allergies reviewed and updated in chart.  Past histories reviewed and updated if relevant as below. Patient Active Problem List  Diagnosis  . Morbid obesity  . DEPRESSION, MAJOR, RECURRENT, MODERATE  . OBSTRUCTIVE SLEEP APNEA  . Atrial fibrillation  . Chronic combined systolic and diastolic CHF (congestive heart failure)  . ALLERGIC RHINITIS  . INSOMNIA  . HYPERGLYCEMIA  . PULMONARY EMBOLISM, HX OF  . Bariatric surgery status  . Medicare annual wellness visit, initial  . Dyslipidemia  . Habitual alcohol use  . Right shoulder pain  . Skin rash  . Thrombocytopenia  . Upper GI bleed   Past Medical History  Diagnosis Date  . Systolic and diastolic CHF, chronic 10/2011    EF 45%; mod-severe MR  . History of acute renal failure 09/2010  . OSA on CPAP     Auto, on 7 usually  . Morbid obesity     s/p gastric bypass 2008  . Allergic rhinitis   . Depression     verified-on effexor 2 times a day  . PE (pulmonary embolism) 2007    h/o PE x1- completed coumadin course  . Asthma childhood  . History of gastroesophageal reflux (GERD)     improved after bypass  . Diabetes  mellitus     improved after bypass  . HLD (hyperlipidemia)     improved after bypass  . HTN (hypertension)     improved after bypass  . Alcohol use   . Atrial fibrillation 10/30/2010    hx   Past Surgical History  Procedure Date  . Cardiac catheterization 12/2006    [facility]-normal LVEF 55%, no regional wall motion abnl, No obstructive CAD noted. Valves WNL (Dr. [name])  . 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 Esophagogastroduodenoscopy 06/2006    path-benign gastric mucosa  . Doppler echocardiography 09/2010    2D-dilated LV, EF 45%, Diastolic dysfunction, Mod-severe MR, Mod pHTN  . Colonoscopy 05/2009    2 polyps, small int hemorrhoids ([place] [name]), rec rpt 5 years  . 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 Esophagogastroduodenoscopy 11/2012    gastritis, normal esophagus and normal jejunum [name])   History  Substance Use Topics  . Smoking status: Former Smoker    Types: Cigarettes    Quit date: 04/01/1975  . Smokeless tobacco: Never Used  . Alcohol Use: Yes     Comment: fifth/day   Family History  Problem Relation Age of Onset  . Emphysema Father   . Coronary artery disease Father   .  Heart attack Father 86  . Heart failure Mother   . Rheumatic fever Sister   . Heart disease Sister     "heart issues"  . Alzheimer's disease Sister   . Cancer Neg Hx    Allergies  Allergen Reactions  . Statins Other (See Comments)    Muscle/joint pains (even red yeast rice)  . Sulfonamide Derivatives     REACTION: mouth breaks out   Current Outpatient Prescriptions on File Prior to Visit  Medication Sig Dispense Refill  . amiodarone (PACERONE) 200 MG tablet Take 1 tablet (200 mg total) by mouth daily.  30 tablet  6  . aspirin 81 MG tablet Take 81 mg by mouth daily.        . Calcium Carbonate-Vitamin D (CALCIUM 600/VITAMIN D) 600-400 MG-UNIT per tablet Take 2 tablets by mouth daily.      . cetirizine  (ZYRTEC) 10 MG tablet Take 1 tablet (10 mg total) by mouth daily.  60 tablet  3  . Cyanocobalamin (B-12 DOTS) 500 MCG SUBL Place 1 tablet under the tongue every morning. 5000 mcg      . digoxin (LANOXIN) 0.25 MG tablet Take 1 tablet (250 mcg total) by mouth daily.  30 tablet  6  . docusate sodium (COLACE) 100 MG capsule Take 100 mg by mouth 2 (two) times daily.        . fenofibrate (TRICOR) 145 MG tablet Take 1 tablet (145 mg total) by mouth daily.  30 tablet  11  . ferrous sulfate 325 (65 FE) MG tablet Take 325 mg by mouth 2 (two) times daily. 2 hours before or after calcium      . fish oil-omega-3 fatty acids 1000 MG capsule Take 2 g by mouth daily.       . fluticasone (FLONASE) 50 MCG/ACT nasal spray Place 2 sprays into the nose daily.  16 g  11  . metoprolol tartrate (LOPRESSOR) 25 MG tablet Take 1 tablet (25 mg total) by mouth 2 (two) times daily.  60 tablet  6  . Multiple Vitamin (MULTIVITAMIN) tablet Take 1 tablet by mouth daily.        Marland Kitchen omeprazole (PRILOSEC) 40 MG capsule Take 40 mg by mouth 2 (two) times daily.      Marland Kitchen torsemide (DEMADEX) 20 MG tablet Take two tablets in the morning and one tablet in the evening for heart.  90 tablet  6  . venlafaxine XR (EFFEXOR-XR) 150 MG 24 hr capsule Take 1 capsule (150 mg total) by mouth 2 (two) times daily.  60 capsule  11  . zolpidem (AMBIEN) 10 MG tablet Take 1 tablet (10 mg total) by mouth at bedtime as needed.  30 tablet  3     Review of Systems Per HPI    Objective:   Physical Exam  Nursing note and vitals reviewed. Constitutional: He appears well-developed and well-nourished. No distress.  Abdominal: Soft. Normal appearance and bowel sounds are normal. He exhibits no distension and no mass. There is hepatomegaly (mild). There is no tenderness. There is no rebound, no guarding and no CVA tenderness.       obese  Skin: Skin is warm and dry. No rash noted.       Assessment & Plan:

## 2012-12-19 NOTE — Patient Instructions (Signed)
Keep appt with urology. 

## 2012-12-19 NOTE — Assessment & Plan Note (Signed)
Incidental finding of R exophytic lower renal pole kidney mass.  Already has referral set up with Dr. Gailen Shelter.  rec keep appt.   Discussed imaging findings and provided with copy of abd CT scan. UA today - no blood.

## 2012-12-31 DIAGNOSIS — N2889 Other specified disorders of kidney and ureter: Secondary | ICD-10-CM

## 2012-12-31 HISTORY — DX: Other specified disorders of kidney and ureter: N28.89

## 2013-01-01 ENCOUNTER — Encounter: Payer: Self-pay | Admitting: *Deleted

## 2013-01-01 ENCOUNTER — Other Ambulatory Visit: Payer: Self-pay | Admitting: *Deleted

## 2013-01-01 MED ORDER — ZOLPIDEM TARTRATE 10 MG PO TABS: 10.0000 mg | ORAL_TABLET | Freq: Every evening | ORAL | Status: DC | PRN

## 2013-01-01 MED ORDER — TORSEMIDE 20 MG PO TABS: ORAL_TABLET | ORAL | Status: DC

## 2013-01-01 NOTE — Telephone Encounter (Signed)
plz phone in. 

## 2013-01-01 NOTE — Telephone Encounter (Signed)
Refilled Torsemide. Scheduled pt for a 1 yr F/u with Dr. Mariah Milling.

## 2013-01-02 NOTE — Telephone Encounter (Signed)
pts wife called status of refill; Sue Lush at [facility] said zolpidem ready for pick up. Advised pts wife.

## 2013-01-02 NOTE — Telephone Encounter (Signed)
Rx called in as directed.   

## 2013-01-12 ENCOUNTER — Ambulatory Visit (INDEPENDENT_AMBULATORY_CARE_PROVIDER_SITE_OTHER): Payer: Managed Care, Other (non HMO) | Admitting: Family Medicine

## 2013-01-12 ENCOUNTER — Encounter: Payer: Self-pay | Admitting: Family Medicine

## 2013-01-12 ENCOUNTER — Ambulatory Visit (INDEPENDENT_AMBULATORY_CARE_PROVIDER_SITE_OTHER)
Admission: RE | Admit: 2013-01-12 | Discharge: 2013-01-12 | Disposition: A | Discharge status: Home / Self Care | Payer: Managed Care, Other (non HMO) | Source: Ambulatory Visit | Attending: Family Medicine | Admitting: Family Medicine

## 2013-01-12 VITALS — BP 124/84 | HR 84 | Temp 97.9°F | Wt 308.0 lb

## 2013-01-12 DIAGNOSIS — N2889 Other specified disorders of kidney and ureter: Secondary | ICD-10-CM

## 2013-01-12 DIAGNOSIS — M545 Low back pain, unspecified: Secondary | ICD-10-CM | POA: Insufficient documentation

## 2013-01-12 DIAGNOSIS — K922 Gastrointestinal hemorrhage, unspecified: Secondary | ICD-10-CM

## 2013-01-12 DIAGNOSIS — N289 Disorder of kidney and ureter, unspecified: Secondary | ICD-10-CM

## 2013-01-12 LAB — POCT URINALYSIS DIPSTICK
Blood, UA: NEGATIVE
Ketones, UA: NEGATIVE
Nitrite, UA: NEGATIVE
Protein, UA: NEGATIVE
pH, UA: 6

## 2013-01-12 MED ORDER — TRAMADOL HCL 50 MG PO TABS: 50.0000 mg | ORAL_TABLET | Freq: Two times a day (BID) | ORAL | Status: DC | PRN

## 2013-01-12 NOTE — Assessment & Plan Note (Signed)
F/u with GI [name]) scheduled for Feb 2014.

## 2013-01-12 NOTE — Assessment & Plan Note (Addendum)
F/u with urology [name]) scheduled for March 2014

## 2013-01-12 NOTE — Patient Instructions (Signed)
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 Take tramadol as needed for pain. Stretching exercises provided today - pass by Marion's office for referral to physical. If not improving with this, let me know for orthopedic referral.

## 2013-01-12 NOTE — Progress Notes (Signed)
Subjective:    Patient ID: Mcgrath Mcgrath, male    DOB: [birth year], 72 y.o.   MRN: [id number]  HPI CC: back pain  Presents with daughter today.  Pleasant 36 yo with h/o OSA on nightly CPAP, morbid obesity s/p gastric bypass 2008, allergic rhinitis, chronic combined systolic and diastolic CHF, pulm HTN, asthma, h/o afib and recent workup by GI for abd pain with concern for upper GI bleed revealing normal EGD but abd CT with renal mass so referred to urology.  Saw urologist - scheduled for rpt CT scan in March Mcgrath Mcgrath). Planned f/u with Dr. Markham Mcgrath in February (GI).  Presents today with complaint of 4 wk h/o back pain described as sharp and worse with movement - ie turning/adjusting or any rapid movements.  If sitting still, standing still, improved.  If laying down - stops immediately.  Using aleve and heating pad.  Worried about prolonged immobility 2/2 h/o blood clots so trying to stay active. Denies shooting pain down legs, numbness or weakness of legs, fevers/chills, bowel/bladder accidents.  Denies urinary sxs, no dysuria, urgency, frequency.  Denies inciting trauma/injury.  Did pick up heavy television about 3 wks ago. Has had physical therapy in past.  Told DDD in past.  H/o back pain like this in past, but worse when weighed more (prior to gastric bypass). Does not want to return to see [name] ortho (prior saw [name] and PA Mcgrath Mcgrath). Wonders if sxs worsened since received flu shot 09/2012 - but he also went home and cut up 2 trees.  reviewed recent CT scan done at [facility] - report did not comment on spine.  Wt Readings from Last 3 Encounters:  01/12/13 308 lb (139.708 kg)  12/19/12 313 lb 4 oz (142.089 kg)  12/03/12 315 lb 4 oz (142.996 kg)    Past Medical History  Diagnosis Date  . Systolic and diastolic CHF, chronic 10/2011    EF 45%; mod-severe MR  . History of acute renal failure 09/2010  . OSA on CPAP     Auto, on 7 usually  . Morbid obesity     s/p gastric bypass 2008  . Allergic  rhinitis   . Depression     verified-on effexor 2 times a day  . PE (pulmonary embolism) 2007    h/o PE x1- completed coumadin course  . Asthma childhood  . History of gastroesophageal reflux (GERD)     improved after bypass  . Diabetes mellitus     improved after bypass  . HLD (hyperlipidemia)     improved after bypass  . HTN (hypertension)     improved after bypass  . Alcohol use   . Atrial fibrillation 10/30/2010    hx    Past Surgical History  Procedure Date  . Cardiac catheterization 12/2006    [facility]-normal LVEF 55%, no regional wall motion abnl, No obstructive CAD noted. Valves WNL (Dr. Claris Mcgrath)  . 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 Esophagogastroduodenoscopy 06/2006    path-benign gastric mucosa  . Doppler echocardiography 09/2010    2D-dilated LV, EF 45%, Diastolic dysfunction, Mod-severe MR, Mod pHTN  . Colonoscopy 05/2009    2 polyps, small int hemorrhoids (Mcgrath Mcgrath), rec rpt 5 years  . 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 Esophagogastroduodenoscopy 11/2012    gastritis, normal esophagus and normal jejunum Mcgrath Mcgrath)   Review of Systems Per HPI    Objective:  Physical Exam  Nursing note and vitals reviewed. Constitutional: He appears well-developed and well-nourished. No distress.  Musculoskeletal: Normal range of motion.       Somewhat stiff movements. No midline spine tenderness.  No significant paraspinous mm tenderness. Neg SLR bilaterally, neg FABER. No pain with palpation of SIJ or GTB or sciatic notch bilaterally  Neurological: No sensory deficit.       Diminished reflexes bilaterally 5/5 strength BLE       Assessment & Plan:

## 2013-01-12 NOTE — Assessment & Plan Note (Addendum)
Anticipate due to OA with age- and weight-related DDD.  Discussed this. Will obtain xrays today given prior h/o back pain/DDD to eval arthritic burden as well as r/o other etiology. Discussed treatment - rec minimize NSAID use (recent gastritis), may use OTC tylenol and tramadol prescribed today for pain relief. Provided with exercises from [facility] pt advisor on lower back pain, will also refer to physical therapy. If not improving with this treatment, to update me for referral to ortho. Pt agrees with plan.

## 2013-01-14 ENCOUNTER — Encounter: Payer: Self-pay | Admitting: Cardiovascular Disease

## 2013-01-14 ENCOUNTER — Ambulatory Visit (INDEPENDENT_AMBULATORY_CARE_PROVIDER_SITE_OTHER): Payer: Medicare Other | Admitting: Cardiovascular Disease

## 2013-01-14 VITALS — BP 130/82 | HR 73 | Ht 72.0 in | Wt 306.0 lb

## 2013-01-14 DIAGNOSIS — E785 Hyperlipidemia, unspecified: Secondary | ICD-10-CM

## 2013-01-14 DIAGNOSIS — I509 Heart failure, unspecified: Secondary | ICD-10-CM

## 2013-01-14 DIAGNOSIS — I4891 Unspecified atrial fibrillation: Secondary | ICD-10-CM

## 2013-01-14 DIAGNOSIS — I5042 Chronic combined systolic (congestive) and diastolic (congestive) heart failure: Secondary | ICD-10-CM

## 2013-01-14 NOTE — Assessment & Plan Note (Signed)
He has repeat cholesterol check on fenofibrate in several months time. If cholesterol continues to be high, he may benefit from WelChol or zetia he does not want a statin given history of myalgias.Marland Kitchen

## 2013-01-14 NOTE — Assessment & Plan Note (Signed)
We have encouraged continued exercise, careful diet management in an effort to lose weight. 

## 2013-01-14 NOTE — Assessment & Plan Note (Signed)
History relatively stable on torsemide twice a day. No edema. No significant valvular regurgitation auscultated on exam. Perhaps this has improved with diuresis compared to 2011.

## 2013-01-14 NOTE — Patient Instructions (Addendum)
You are doing well. No medication changes were made.  Please call us if you have new issues that need to be addressed before your next appt.  Your physician wants you to follow-up in: 6 months.  You will receive a reminder letter in the mail two months in advance. If you don't receive a letter, please call our office to schedule the follow-up appointment.   

## 2013-01-14 NOTE — Assessment & Plan Note (Signed)
No arrhythmia by his report. Maintaining normal sinus rhythm today. No changes to his medications.

## 2013-01-14 NOTE — Progress Notes (Signed)
Patient ID: Jordan Mcgrath, male    DOB: 1941-08-06, 72 y.o.   MRN: [id number]  HPI Comments: Jordan Mcgrath is a 72 yo gentleman, with a hx of  gastric bypass in 2008, CHF, renal insufficiency, obstructive sleep apnea and uses CPAP, diabetes that is diet controlled, hypertension.  moderate pulmonary hypertension, moderate to severe mitral regurgitation, ejection fraction 45%. On telemetry, he had runs of SVT versus short runs of atrial fibrillation, History of PE in 2006, atrial fibrillation and RVR with rate of 141 beats per minute in 09/2010. He was started on amiodarone loading and over the next week or so, converted to normal sinus rhythm as confirmed by EKG on November 08, 2010. He presents for routine followup. Negative cardiac catheterization in 2008  Reports that he had an accident early last year where his fingers were cut off. Surgical repair at [facility] stage several of his fingers on the left hand. He denies any significant edema, shortness of breath or chest pain. He has not been exercising. He scheduled to start physical therapy soon for his back and would like to restart exercise. He reports having hives over the summer and saw dermatology. Resolution of his hives without intervention or changing his medications. History of myalgias on statins.    His father passed from an MI in his early 38s but he was a heavy smoker  echocardiogram from October 7 of this year shows ejection fraction 45%, mildly dilated LV, diastolic dysfunction, mildly dilated RV, moderate to severe MR, moderately elevated right ventricular systolic pressures estimated at 50 mm of mercury.   he reports having a cardiac catheterization prior to his gastric bypass in 2008 showed no significant coronary artery disease. This was done at [facility]   EKG showsnormal sinus rhythm with rate 73 beats per minute, intraventricular conduction delay, left axis deviation/left anterior fascicular block      Outpatient  Encounter Prescriptions as of 01/14/2013  Medication Sig Dispense Refill  . amiodarone (PACERONE) 200 MG tablet Take 1 tablet (200 mg total) by mouth daily.  30 tablet  6  . aspirin 81 MG tablet Take 81 mg by mouth daily.        . Calcium Carbonate-Vitamin D (CALCIUM 600/VITAMIN D) 600-400 MG-UNIT per tablet Take 2 tablets by mouth daily.      . cetirizine (ZYRTEC) 10 MG tablet Take 1 tablet (10 mg total) by mouth daily.  60 tablet  3  . Cyanocobalamin (B-12 DOTS) 500 MCG SUBL Place 1 tablet under the tongue every morning. 5000 mcg      . digoxin (LANOXIN) 0.25 MG tablet Take 1 tablet (250 mcg total) by mouth daily.  30 tablet  6  . docusate sodium (COLACE) 100 MG capsule Take 100 mg by mouth 2 (two) times daily.        . fenofibrate (TRICOR) 145 MG tablet Take 1 tablet (145 mg total) by mouth daily.  30 tablet  11  . ferrous sulfate 325 (65 FE) MG tablet Take 325 mg by mouth 2 (two) times daily. 2 hours before or after calcium      . fish oil-omega-3 fatty acids 1000 MG capsule Take 2 g by mouth daily.       . fluticasone (FLONASE) 50 MCG/ACT nasal spray Place 2 sprays into the nose daily.  16 g  11  . metoprolol tartrate (LOPRESSOR) 25 MG tablet Take 1 tablet (25 mg total) by mouth 2 (two) times daily.  60 tablet  6  . Multiple Vitamin (MULTIVITAMIN) tablet Take 1 tablet by mouth daily.        Marland Kitchen omeprazole (PRILOSEC) 40 MG capsule Take 1 capsule (40 mg total) by mouth 2 (two) times daily.  60 capsule  3  . torsemide (DEMADEX) 20 MG tablet Take two tablets in the morning and one tablet in the evening for heart.  90 tablet  0  . traMADol (ULTRAM) 50 MG tablet Take 1 tablet (50 mg total) by mouth 2 (two) times daily as needed for pain.  40 tablet  0  . venlafaxine XR (EFFEXOR-XR) 150 MG 24 hr capsule Take 1 capsule (150 mg total) by mouth 2 (two) times daily.  60 capsule  11  . zolpidem (AMBIEN) 10 MG tablet Take 1 tablet (10 mg total) by mouth at bedtime as needed.  30 tablet  3     Review of  Systems  Constitutional: Negative.   HENT: Negative.   Eyes: Negative.   Respiratory: Negative.   Cardiovascular: Negative.   Gastrointestinal: Negative.   Musculoskeletal: Positive for back pain.  Skin: Negative.   Neurological: Negative.   Hematological: Negative.   Psychiatric/Behavioral: Negative.   All other systems reviewed and are negative.    BP 130/82  Pulse 73  Ht 6' (1.829 m)  Wt 306 lb (138.801 kg)  BMI 41.50 kg/m2  Physical Exam  Nursing note and vitals reviewed. Constitutional: He is oriented to person, place, and time. He appears well-developed and well-nourished.       obese  HENT:  Head: Normocephalic.  Nose: Nose normal.  Mouth/Throat: Oropharynx is clear and moist.  Eyes: Conjunctivae normal are normal. Pupils are equal, round, and reactive to light.  Neck: Normal range of motion. Neck supple. No JVD present.  Cardiovascular: Normal rate, regular rhythm, S1 normal, S2 normal, normal heart sounds and intact distal pulses.  Exam reveals no gallop and no friction rub.   No murmur heard. Pulmonary/Chest: Effort normal and breath sounds normal. No respiratory distress. He has no wheezes. He has no rales. He exhibits no tenderness.  Abdominal: Soft. Bowel sounds are normal. He exhibits no distension. There is no tenderness.  Musculoskeletal: Normal range of motion. He exhibits no edema and no tenderness.  Lymphadenopathy:    He has no cervical adenopathy.  Neurological: He is alert and oriented to person, place, and time. Coordination normal.  Skin: Skin is warm and dry. No rash noted. No erythema.  Psychiatric: He has a normal mood and affect. His behavior is normal. Judgment and thought content normal.           Assessment and Plan

## 2013-01-18 ENCOUNTER — Encounter: Payer: Self-pay | Admitting: Family Medicine

## 2013-01-27 ENCOUNTER — Other Ambulatory Visit: Payer: Self-pay | Admitting: *Deleted

## 2013-01-27 MED ORDER — FLUTICASONE PROPIONATE 50 MCG/ACT NA SUSP: 2.0000 | Freq: Every day | NASAL | Status: DC

## 2013-02-03 ENCOUNTER — Other Ambulatory Visit: Payer: Self-pay

## 2013-02-03 MED ORDER — TORSEMIDE 20 MG PO TABS: ORAL_TABLET | ORAL | Status: DC

## 2013-02-03 NOTE — Telephone Encounter (Signed)
Refill sent for Torsemide 20 mg take two tablets in the am and one tablet in the pm.

## 2013-02-23 ENCOUNTER — Other Ambulatory Visit: Payer: Self-pay | Admitting: *Deleted

## 2013-02-23 MED ORDER — AMIODARONE HCL 200 MG PO TABS: 200.0000 mg | ORAL_TABLET | Freq: Every day | ORAL | Status: DC

## 2013-02-23 NOTE — Telephone Encounter (Signed)
Refilled Amiodarone sent to [facility] pharmacy.

## 2013-03-03 ENCOUNTER — Other Ambulatory Visit: Payer: Self-pay | Admitting: *Deleted

## 2013-03-03 DIAGNOSIS — M545 Low back pain: Secondary | ICD-10-CM

## 2013-03-03 MED ORDER — CETIRIZINE HCL 10 MG PO TABS: 10.0000 mg | ORAL_TABLET | Freq: Every day | ORAL | Status: AC

## 2013-03-03 NOTE — Telephone Encounter (Signed)
Ok to refill 

## 2013-03-04 MED ORDER — TRAMADOL HCL 50 MG PO TABS: 50.0000 mg | ORAL_TABLET | Freq: Two times a day (BID) | ORAL | Status: DC | PRN

## 2013-04-06 ENCOUNTER — Other Ambulatory Visit: Payer: Self-pay | Admitting: *Deleted

## 2013-04-06 MED ORDER — METOPROLOL TARTRATE 25 MG PO TABS: 25.0000 mg | ORAL_TABLET | Freq: Two times a day (BID) | ORAL | Status: DC

## 2013-04-06 MED ORDER — DIGOXIN 250 MCG PO TABS: 250.0000 ug | ORAL_TABLET | Freq: Every day | ORAL | Status: DC

## 2013-04-22 ENCOUNTER — Other Ambulatory Visit: Payer: Self-pay | Admitting: *Deleted

## 2013-04-22 MED ORDER — OMEPRAZOLE 40 MG PO CPDR: 40.0000 mg | DELAYED_RELEASE_CAPSULE | Freq: Two times a day (BID) | ORAL | Status: DC

## 2013-04-24 ENCOUNTER — Other Ambulatory Visit: Payer: Self-pay | Admitting: Family Medicine

## 2013-04-24 DIAGNOSIS — Z125 Encounter for screening for malignant neoplasm of prostate: Secondary | ICD-10-CM

## 2013-04-24 DIAGNOSIS — E785 Hyperlipidemia, unspecified: Secondary | ICD-10-CM

## 2013-04-24 DIAGNOSIS — D696 Thrombocytopenia, unspecified: Secondary | ICD-10-CM

## 2013-04-29 ENCOUNTER — Other Ambulatory Visit: Payer: Self-pay | Admitting: *Deleted

## 2013-04-29 MED ORDER — ZOLPIDEM TARTRATE 10 MG PO TABS: 10.0000 mg | ORAL_TABLET | Freq: Every evening | ORAL | Status: DC | PRN

## 2013-04-29 NOTE — Telephone Encounter (Signed)
Medicine called to [facility].

## 2013-04-29 NOTE — Telephone Encounter (Signed)
Last filled 03/30/13

## 2013-05-08 ENCOUNTER — Other Ambulatory Visit (INDEPENDENT_AMBULATORY_CARE_PROVIDER_SITE_OTHER): Payer: Managed Care, Other (non HMO)

## 2013-05-08 DIAGNOSIS — R7309 Other abnormal glucose: Secondary | ICD-10-CM

## 2013-05-08 DIAGNOSIS — Z125 Encounter for screening for malignant neoplasm of prostate: Secondary | ICD-10-CM

## 2013-05-08 DIAGNOSIS — E785 Hyperlipidemia, unspecified: Secondary | ICD-10-CM

## 2013-05-08 DIAGNOSIS — D696 Thrombocytopenia, unspecified: Secondary | ICD-10-CM

## 2013-05-08 DIAGNOSIS — Z0000 Encounter for general adult medical examination without abnormal findings: Secondary | ICD-10-CM

## 2013-05-08 DIAGNOSIS — M545 Low back pain: Secondary | ICD-10-CM

## 2013-05-08 LAB — COMPREHENSIVE METABOLIC PANEL
ALT: 19 U/L (ref 0–53)
AST: 21 U/L (ref 0–37)
Alkaline Phosphatase: 49 U/L (ref 39–117)
Calcium: 9.2 mg/dL (ref 8.4–10.5)
Chloride: 98 mEq/L (ref 96–112)
Creatinine, Ser: 1.1 mg/dL (ref 0.4–1.5)

## 2013-05-08 LAB — CBC WITH DIFFERENTIAL/PLATELET
Basophils Absolute: 0 10*3/uL (ref 0.0–0.1)
Eosinophils Absolute: 0.5 10*3/uL (ref 0.0–0.7)
HCT: 46.3 % (ref 39.0–52.0)
Hemoglobin: 15.8 g/dL (ref 13.0–17.0)
Lymphocytes Relative: 19.6 % (ref 12.0–46.0)
Lymphs Abs: 1.7 10*3/uL (ref 0.7–4.0)
MCHC: 34 g/dL (ref 30.0–36.0)
MCV: 97.1 fl (ref 78.0–100.0)
Monocytes Absolute: 0.9 10*3/uL (ref 0.1–1.0)
Neutro Abs: 5.3 10*3/uL (ref 1.4–7.7)
RDW: 13.3 % (ref 11.5–14.6)

## 2013-05-08 LAB — LIPID PANEL
Cholesterol: 211 mg/dL — ABNORMAL HIGH (ref 0–200)
Total CHOL/HDL Ratio: 6
VLDL: 64 mg/dL — ABNORMAL HIGH (ref 0.0–40.0)

## 2013-05-15 ENCOUNTER — Ambulatory Visit (INDEPENDENT_AMBULATORY_CARE_PROVIDER_SITE_OTHER): Payer: Managed Care, Other (non HMO) | Admitting: Family Medicine

## 2013-05-15 ENCOUNTER — Encounter: Payer: Self-pay | Admitting: Family Medicine

## 2013-05-15 VITALS — BP 110/76 | HR 76 | Temp 97.7°F | Ht 70.0 in | Wt 307.0 lb

## 2013-05-15 DIAGNOSIS — E785 Hyperlipidemia, unspecified: Secondary | ICD-10-CM

## 2013-05-15 DIAGNOSIS — R7309 Other abnormal glucose: Secondary | ICD-10-CM

## 2013-05-15 DIAGNOSIS — N281 Cyst of kidney, acquired: Secondary | ICD-10-CM

## 2013-05-15 DIAGNOSIS — J309 Allergic rhinitis, unspecified: Secondary | ICD-10-CM

## 2013-05-15 DIAGNOSIS — G4733 Obstructive sleep apnea (adult) (pediatric): Secondary | ICD-10-CM

## 2013-05-15 DIAGNOSIS — D696 Thrombocytopenia, unspecified: Secondary | ICD-10-CM

## 2013-05-15 DIAGNOSIS — F331 Major depressive disorder, recurrent, moderate: Secondary | ICD-10-CM

## 2013-05-15 DIAGNOSIS — Z7289 Other problems related to lifestyle: Secondary | ICD-10-CM

## 2013-05-15 DIAGNOSIS — Z1331 Encounter for screening for depression: Secondary | ICD-10-CM

## 2013-05-15 DIAGNOSIS — F1010 Alcohol abuse, uncomplicated: Secondary | ICD-10-CM

## 2013-05-15 DIAGNOSIS — Q619 Cystic kidney disease, unspecified: Secondary | ICD-10-CM

## 2013-05-15 DIAGNOSIS — Z0000 Encounter for general adult medical examination without abnormal findings: Secondary | ICD-10-CM

## 2013-05-15 NOTE — Patient Instructions (Addendum)
Try different antihistamine like claritin or allegra Pass by Marion's office to schedule repeat CT scan of kidneys. Slowly cut down on alcohol to help mood and to help triglycerides. Pass by Marion's office for referral to Dr. Laymond Purser for counseling.

## 2013-05-15 NOTE — Progress Notes (Signed)
Subjective:    Patient ID: Mcgrath Mcgrath, male    DOB: [birth year], 72 y.o.   MRN: [id number]  HPI CC: CPE  Pleasant 72 yo with h/o OSA on nightly CPAP, morbid obesity s/p gastric bypass 2008, allergic rhinitis, chronic combined systolic and diastolic CHF, pulm HTN, asthma, h/o afib and recent workup by GI for abd pain with concern for upper GI bleed revealing normal EGD but abd CT with renal mass. Saw urologist - scheduled for rpt CT scan in March Mcgrath Mcgrath). Has not had this study done yet.  Lab Results  Component Value Date   HGBA1C 5.5 04/23/2012    Allergic rhinitis - worsening recently.  On flonase and cetirizine for years. R elbow pain for the last week. Continued neck and shoulder aches.  Denies eye opener, denies trouble with withdrawal. Drinks 1 pint bourbon/day.  Lives with wife.  Has 1 daughter, 1 son. 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 Edu: college Activity: no regular exercise Diet: good water, fruits/vegetables daily  Passes hearing and vision screens.  Sees eye doctor regularly. No falls in last year. Depression - PHQ2 = 6.  Has seen psychiatrist in past.  Interested in Veterinary surgeon.  Preventative:  Colonsocopy 2010, rec rpt 5 yrs, small sessile polyps found.  Prostate always normal Completed shingles, PNA shot.  Flu shot last year. Tetanus 2013 Advanced directives: would want HCPOA to be wife or daughter.  Does not want prolonged life support.  Medications and allergies reviewed and updated in chart.  Past histories reviewed and updated if relevant as below. Patient Active Problem List   Diagnosis Date Noted  . LBP (low back pain) 01/12/2013  . Atypical renal cyst, right 12/19/2012  . Upper GI bleed 11/19/2012  . Thrombocytopenia 10/15/2012  . Skin rash  07/18/2012  . Right shoulder pain 01/23/2012  . Habitual alcohol use   . Dyslipidemia 11/26/2011  . Medicare annual wellness visit, initial 04/23/2011  . INSOMNIA 11/27/2010  . Atrial fibrillation 10/30/2010  . HYPERGLYCEMIA 10/19/2010  . Chronic combined systolic and diastolic CHF (congestive heart failure) 10/12/2010  . Morbid obesity 08/10/2010  . DEPRESSION, MAJOR, RECURRENT, MODERATE 08/10/2010  . OBSTRUCTIVE SLEEP APNEA 08/10/2010  . ALLERGIC RHINITIS 08/10/2010  . PULMONARY EMBOLISM, HX OF 08/10/2010  . Bariatric surgery status 08/10/2010   Past Medical History  Diagnosis Date  . Systolic and diastolic CHF, chronic 10/2011    EF 45%; mod-severe MR  . History of acute renal failure 09/2010  . OSA on CPAP     Auto, on 7 usually  . Morbid obesity     s/p gastric bypass 2008  . Allergic rhinitis   . Depression     verified-on effexor 2 times a day  . PE (pulmonary embolism) 2007    h/o PE x1- completed coumadin course  . Asthma childhood  . History of gastroesophageal reflux (GERD)     improved after bypass  . Diabetes mellitus     improved after bypass  . HLD (hyperlipidemia)     improved after bypass  . HTN (hypertension)     improved after bypass  . Alcohol use   . Atrial fibrillation 10/30/2010    hx   Past Surgical History  Procedure Laterality Date  . Cardiac catheterization  12/2006    [facility]-normal LVEF 55%, no regional wall  motion abnl, No obstructive CAD noted. Valves WNL (Mcgrath Mcgrath)  . Gastric bypass  12/02/2007    ([facility])  . Esophagogastroduodenoscopy  06/2006    path-benign gastric mucosa  . Doppler echocardiography  09/2010    2D-dilated LV, EF 45%, Diastolic dysfunction, Mod-severe MR, Mod pHTN  . Colonoscopy  05/2009    2 polyps, small int hemorrhoids ([place]le Mcgrath Mcgrath), rec rpt 5 years  . 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 Esophagogastroduodenoscopy  11/2012     gastritis, normal esophagus and normal jejunum [place]ham Mcgrath)   History  Substance Use Topics  . Smoking status: Former Smoker    Types: Cigarettes    Quit date: 04/01/1975  . Smokeless tobacco: Never Used  . Alcohol Use: Yes     Comment: fifth/day   Family History  Problem Relation Age of Onset  . Emphysema Father   . Coronary artery disease Father   . Heart attack Father 52  . Heart failure Mother   . Rheumatic fever Sister   . Heart disease Sister     "heart issues"  . Alzheimer's disease Sister   . Cancer Neg Hx    Allergies  Allergen Reactions  . Statins Other (See Comments)    Muscle/joint pains (even red yeast rice)  . Sulfonamide Derivatives     REACTION: mouth breaks out   Current Outpatient Prescriptions on File Prior to Visit  Medication Sig Dispense Refill  . amiodarone (PACERONE) 200 MG tablet Take 1 tablet (200 mg total) by mouth daily.  30 tablet  6  . aspirin 81 MG tablet Take 81 mg by mouth daily.        . Calcium Carbonate-Vitamin D (CALCIUM 600/VITAMIN D) 600-400 MG-UNIT per tablet Take 2 tablets by mouth daily.      . cetirizine (ZYRTEC) 10 MG tablet Take 1 tablet (10 mg total) by mouth daily.  60 tablet  3  . Cyanocobalamin (B-12 DOTS) 500 MCG SUBL Place 1 tablet under the tongue every morning. 5000 mcg      . digoxin (LANOXIN) 0.25 MG tablet Take 1 tablet (250 mcg total) by mouth daily.  30 tablet  6  . docusate sodium (COLACE) 100 MG capsule Take 100 mg by mouth 2 (two) times daily.        . fenofibrate (TRICOR) 145 MG tablet Take 1 tablet (145 mg total) by mouth daily.  30 tablet  11  . ferrous sulfate 325 (65 FE) MG tablet Take 325 mg by mouth 2 (two) times daily. 2 hours before or after calcium      . fish oil-omega-3 fatty acids 1000 MG capsule Take 2 g by mouth daily.       . fluticasone (FLONASE) 50 MCG/ACT nasal spray Place 2 sprays into the nose daily.  16 g  5  . metoprolol tartrate (LOPRESSOR) 25 MG tablet Take 1 tablet (25 mg total) by mouth 2  (two) times daily.  60 tablet  6  . Multiple Vitamin (MULTIVITAMIN) tablet Take 1 tablet by mouth daily.        Marland Kitchen omeprazole (PRILOSEC) 40 MG capsule Take 1 capsule (40 mg total) by mouth 2 (two) times daily.  60 capsule  5  . torsemide (DEMADEX) 20 MG tablet Take two tablets in the morning and one tablet in the evening for heart.  90 tablet  3  . traMADol (ULTRAM) 50 MG tablet Take  1 tablet (50 mg total) by mouth 2 (two) times daily as needed for pain.  40 tablet  0  . venlafaxine XR (EFFEXOR-XR) 150 MG 24 hr capsule Take 1 capsule (150 mg total) by mouth 2 (two) times daily.  60 capsule  11  . zolpidem (AMBIEN) 10 MG tablet Take 1 tablet (10 mg total) by mouth at bedtime as needed.  30 tablet  3   No current facility-administered medications on file prior to visit.     Review of Systems  Constitutional: Negative for fever, chills, activity change, appetite change, fatigue and unexpected weight change.  HENT: Negative for hearing loss and neck pain.   Eyes: Negative for visual disturbance.  Respiratory: Positive for shortness of breath. Negative for cough, chest tightness and wheezing.   Cardiovascular: Negative for chest pain, palpitations and leg swelling.  Gastrointestinal: Negative for nausea, vomiting, abdominal pain, diarrhea, constipation, blood in stool and abdominal distention.  Genitourinary: Negative for hematuria and difficulty urinating.  Musculoskeletal: Negative for myalgias and arthralgias.  Skin: Negative for rash.  Neurological: Negative for dizziness, seizures, syncope and headaches.  Hematological: Negative for adenopathy. Does not bruise/bleed easily.  Psychiatric/Behavioral: Positive for dysphoric mood. The patient is nervous/anxious.        Objective:   Physical Exam  Nursing note and vitals reviewed. Constitutional: He is oriented to person, place, and time. He appears well-developed and well-nourished. No distress.  HENT:  Head: Normocephalic and atraumatic.   Nose: Nose normal.  Mouth/Throat: Oropharynx is clear and moist. No oropharyngeal exudate.  Eyes: Conjunctivae and EOM are normal. Pupils are equal, round, and reactive to light. No scleral icterus.  Neck: Normal range of motion. Neck supple. Carotid bruit is not present.  Cardiovascular: Normal rate, regular rhythm, normal heart sounds and intact distal pulses.   No murmur heard. Pulses:      Radial pulses are 2+ on the right side, and 2+ on the left side.  Pulmonary/Chest: Effort normal and breath sounds normal. No respiratory distress. He has no wheezes. He has no rales.  Abdominal: Soft. Bowel sounds are normal. He exhibits no distension and no mass. There is no tenderness. There is no rebound and no guarding.  Genitourinary: Rectum normal and prostate normal. Rectal exam shows no external hemorrhoid, no internal hemorrhoid, no fissure, no mass, no tenderness and anal tone normal. Prostate is not enlarged (10-15gm) and not tender.  Musculoskeletal: Normal range of motion. He exhibits no edema.  Lymphadenopathy:    He has no cervical adenopathy.  Neurological: He is alert and oriented to person, place, and time.  CN grossly intact, station and gait intact  Skin: Skin is warm and dry. No rash noted.  Psychiatric: He has a normal mood and affect. His behavior is normal. Judgment and thought content normal.       Assessment & Plan:

## 2013-05-17 ENCOUNTER — Encounter: Payer: Self-pay | Admitting: Family Medicine

## 2013-05-17 NOTE — Assessment & Plan Note (Signed)
Due for f/u CT, never completed.  Did not f/u with Dr. [name]. I have ordered repeat CT scan to document stability.

## 2013-05-17 NOTE — Assessment & Plan Note (Signed)
CPAP nightly

## 2013-05-17 NOTE — Assessment & Plan Note (Addendum)
longterm on effexor bid.  Notes poor control of depression. Discussed options - pt requests referral to counselor - placed referral to Dr. Laymond Purser. PHQ9 = 14, extremely difficult to function.

## 2013-05-17 NOTE — Assessment & Plan Note (Signed)
Suggested trial of different antihistamine. 

## 2013-05-17 NOTE — Assessment & Plan Note (Addendum)
I have personally reviewed the Medicare Annual Wellness questionnaire and have noted 1. The patient's medical and social history 2. Their use of alcohol, tobacco or illicit drugs 3. Their current medications and supplements 4. The patient's functional ability including ADL's, fall risks, home safety risks and hearing or visual impairment. 5. Diet and physical activity 6. Evidence for depression or mood disorders The patients weight, height, BMI have been recorded in the chart.  Hearing and vision has been addressed. I have made referrals, counseling and provided education to the patient based review of the above and I have provided the pt with a written personalized care plan for preventive services. See scanned questionairre. Advanced directives discussed.  Reviewed preventative protocols and updated unless pt declined.  UTD colonoscopy and prostate screening (PSA/DRE reassuring). UTD immunizations

## 2013-05-17 NOTE — Assessment & Plan Note (Signed)
1 pint hard liquor per day. Discussed relationship between EtOH and depression. Encouraged slowly cut back on alcohol intake.

## 2013-05-17 NOTE — Assessment & Plan Note (Signed)
Reviewed #s with patient, again encouraged decreased alcohol intake. Currently on fenofibrate and fish oil. Discussed possible zetia vs welchol. Discussed healthy diet changes.

## 2013-05-17 NOTE — Assessment & Plan Note (Signed)
Stable A1c.

## 2013-05-17 NOTE — Assessment & Plan Note (Signed)
Improved

## 2013-05-18 NOTE — Addendum Note (Signed)
Addended by: [name] on: 05/18/2013 08:08 AM   Modules accepted: Orders

## 2013-05-19 ENCOUNTER — Ambulatory Visit: Payer: Self-pay | Admitting: Family Medicine

## 2013-05-20 ENCOUNTER — Encounter: Payer: Self-pay | Admitting: Family Medicine

## 2013-05-20 ENCOUNTER — Other Ambulatory Visit: Payer: Self-pay | Admitting: Family Medicine

## 2013-05-20 DIAGNOSIS — N2889 Other specified disorders of kidney and ureter: Secondary | ICD-10-CM

## 2013-05-21 ENCOUNTER — Encounter: Payer: Self-pay | Admitting: Family Medicine

## 2013-05-29 ENCOUNTER — Encounter: Payer: Self-pay | Admitting: Family Medicine

## 2013-05-29 ENCOUNTER — Other Ambulatory Visit: Payer: Self-pay | Admitting: *Deleted

## 2013-05-29 MED ORDER — TORSEMIDE 20 MG PO TABS: ORAL_TABLET | ORAL | Status: DC

## 2013-05-29 NOTE — Telephone Encounter (Signed)
Refilled Torsemide sent to [facility].

## 2013-06-10 ENCOUNTER — Ambulatory Visit (INDEPENDENT_AMBULATORY_CARE_PROVIDER_SITE_OTHER): Payer: Managed Care, Other (non HMO) | Admitting: Psychology

## 2013-06-10 DIAGNOSIS — F331 Major depressive disorder, recurrent, moderate: Secondary | ICD-10-CM

## 2013-06-15 ENCOUNTER — Other Ambulatory Visit: Payer: Self-pay | Admitting: *Deleted

## 2013-06-15 DIAGNOSIS — M545 Low back pain: Secondary | ICD-10-CM

## 2013-06-15 MED ORDER — TRAMADOL HCL 50 MG PO TABS: 50.0000 mg | ORAL_TABLET | Freq: Two times a day (BID) | ORAL | Status: DC | PRN

## 2013-06-15 NOTE — Telephone Encounter (Signed)
Received faxed refill request from pharmacy. Last office visit 05/15/13. Is it okay to refill?

## 2013-06-16 NOTE — Telephone Encounter (Signed)
Enc opened in error

## 2013-06-24 ENCOUNTER — Ambulatory Visit (INDEPENDENT_AMBULATORY_CARE_PROVIDER_SITE_OTHER): Payer: Managed Care, Other (non HMO) | Admitting: Psychology

## 2013-06-24 DIAGNOSIS — F331 Major depressive disorder, recurrent, moderate: Secondary | ICD-10-CM

## 2013-07-16 ENCOUNTER — Encounter: Payer: Self-pay | Admitting: Cardiovascular Disease

## 2013-07-16 ENCOUNTER — Ambulatory Visit (INDEPENDENT_AMBULATORY_CARE_PROVIDER_SITE_OTHER): Payer: Managed Care, Other (non HMO) | Admitting: Cardiovascular Disease

## 2013-07-16 VITALS — BP 122/78 | HR 80 | Ht 72.0 in | Wt 314.5 lb

## 2013-07-16 DIAGNOSIS — I5042 Chronic combined systolic (congestive) and diastolic (congestive) heart failure: Secondary | ICD-10-CM

## 2013-07-16 DIAGNOSIS — I4891 Unspecified atrial fibrillation: Secondary | ICD-10-CM

## 2013-07-16 DIAGNOSIS — I509 Heart failure, unspecified: Secondary | ICD-10-CM

## 2013-07-16 DIAGNOSIS — E785 Hyperlipidemia, unspecified: Secondary | ICD-10-CM

## 2013-07-16 DIAGNOSIS — N2889 Other specified disorders of kidney and ureter: Secondary | ICD-10-CM

## 2013-07-16 DIAGNOSIS — N289 Disorder of kidney and ureter, unspecified: Secondary | ICD-10-CM

## 2013-07-16 MED ORDER — PRAVASTATIN SODIUM 20 MG PO TABS: 20.0000 mg | ORAL_TABLET | Freq: Every day | ORAL | Status: DC

## 2013-07-16 NOTE — Assessment & Plan Note (Signed)
No changes made to his medications. We'll continue torsemide. Recent blood work does not show overdiuresis.

## 2013-07-16 NOTE — Assessment & Plan Note (Signed)
We have encouraged continued exercise, careful diet management in an effort to lose weight. 

## 2013-07-16 NOTE — Assessment & Plan Note (Signed)
We discussed his hyperlipidemia in detail. He is willing to try low-dose statin again. We'll try pravastatin 10 mg with slow titration upwards.

## 2013-07-16 NOTE — Assessment & Plan Note (Signed)
Maintaining normal sinus rhythm. Continue current medications 

## 2013-07-16 NOTE — Assessment & Plan Note (Signed)
In terms of his preoperative evaluation if renal surgery is needed, he would be acceptable risk from a cardiac perspective. No further testing needed.

## 2013-07-16 NOTE — Progress Notes (Signed)
Patient ID: Jordan Mcgrath, male    DOB: Dec 31, 1941, 72 y.o.   MRN: [id number]  HPI Comments: Jordan Mcgrath is a 72 yo gentleman, with a hx of  gastric bypass in 2008, CHF, renal insufficiency, obstructive sleep apnea and uses CPAP, diabetes that is diet controlled, hypertension.  moderate pulmonary hypertension, moderate to severe mitral regurgitation, ejection fraction 45%. On telemetry, he had runs of SVT versus short runs of atrial fibrillation, History of PE in 2006, atrial fibrillation and RVR with rate of 141 beats per minute in 09/2010. He was started on amiodarone loading and over the next week or so, converted to normal sinus rhythm as confirmed by EKG on November 08, 2010. He presents for routine followup. Negative cardiac catheterization in 2008   accident last year where his fingers were cut off. Surgical repair at [facility] stage several of his fingers on the left hand.  He denies any significant edema, shortness of breath or chest pain. He has not been exercising.  He has had a stressful time recently. Told he has a mass on his right kidney. He is meeting with physicians in [place] to discuss various treatment options in the near future History of myalgias on Lipitor and Crestor. He continues to have underlying hyperlipidemia. His father passed from an MI in his early 14s but he was a heavy smoker  echocardiogram from October 7 of this year shows ejection fraction 45%, mildly dilated LV, diastolic dysfunction, mildly dilated RV, moderate to severe MR, moderately elevated right ventricular systolic pressures estimated at 50 mm of mercury.   he reports having a cardiac catheterization prior to his gastric bypass in 2008 showed no significant coronary artery disease. This was done at [facility]   EKG shows normal sinus rhythm with rate 80 beats per minute, left bundle branch block      Outpatient Encounter Prescriptions as of 07/16/2013  Medication Sig Dispense Refill  .  amiodarone (PACERONE) 200 MG tablet Take 1 tablet (200 mg total) by mouth daily.  30 tablet  6  . aspirin 81 MG tablet Take 81 mg by mouth daily.        . Calcium Carbonate-Vitamin D (CALCIUM 600/VITAMIN D) 600-400 MG-UNIT per tablet Take 2 tablets by mouth daily.      . cetirizine (ZYRTEC) 10 MG tablet Take 1 tablet (10 mg total) by mouth daily.  60 tablet  3  . Cyanocobalamin (B-12 DOTS) 500 MCG SUBL Place 1 tablet under the tongue every morning. 5000 mcg      . digoxin (LANOXIN) 0.25 MG tablet Take 1 tablet (250 mcg total) by mouth daily.  30 tablet  6  . docusate sodium (COLACE) 100 MG capsule Take 100 mg by mouth 2 (two) times daily.        . fenofibrate (TRICOR) 145 MG tablet Take 1 tablet (145 mg total) by mouth daily.  30 tablet  11  . ferrous sulfate 325 (65 FE) MG tablet Take 325 mg by mouth 2 (two) times daily. 2 hours before or after calcium      . fish oil-omega-3 fatty acids 1000 MG capsule Take 2 g by mouth daily.       . fluticasone (FLONASE) 50 MCG/ACT nasal spray Place 2 sprays into the nose daily.  16 g  5  . metoprolol tartrate (LOPRESSOR) 25 MG tablet Take 1 tablet (25 mg total) by mouth 2 (two) times daily.  60 tablet  6  . Multiple Vitamin (MULTIVITAMIN)  tablet Take 1 tablet by mouth daily.        Marland Kitchen omeprazole (PRILOSEC) 40 MG capsule Take 1 capsule (40 mg total) by mouth 2 (two) times daily.  60 capsule  5  . torsemide (DEMADEX) 20 MG tablet Take two tablets in the morning and one tablet in the evening for heart.  90 tablet  3  . traMADol (ULTRAM) 50 MG tablet Take 1 tablet (50 mg total) by mouth 2 (two) times daily as needed for pain.  40 tablet  1  . venlafaxine XR (EFFEXOR-XR) 150 MG 24 hr capsule Take 1 capsule (150 mg total) by mouth 2 (two) times daily.  60 capsule  11  . zolpidem (AMBIEN) 10 MG tablet Take 1 tablet (10 mg total) by mouth at bedtime as needed.  30 tablet  3   No facility-administered encounter medications on file as of 07/16/2013.     Review of  Systems  Constitutional: Negative.   HENT: Negative.   Eyes: Negative.   Respiratory: Negative.   Cardiovascular: Negative.   Gastrointestinal: Negative.   Musculoskeletal: Positive for back pain.  Skin: Negative.   Neurological: Negative.   Psychiatric/Behavioral: Negative.   All other systems reviewed and are negative.    BP 122/78  Pulse 80  Ht 6' (1.829 m)  Wt 314 lb 8 oz (142.656 kg)  BMI 42.64 kg/m2  Physical Exam  Nursing note and vitals reviewed. Constitutional: He is oriented to person, place, and time. He appears well-developed and well-nourished.  obese  HENT:  Head: Normocephalic.  Nose: Nose normal.  Mouth/Throat: Oropharynx is clear and moist.  Eyes: Conjunctivae are normal. Pupils are equal, round, and reactive to light.  Neck: Normal range of motion. Neck supple. No JVD present.  Cardiovascular: Normal rate, regular rhythm, S1 normal, S2 normal, normal heart sounds and intact distal pulses.  Exam reveals no gallop and no friction rub.   No murmur heard. Pulmonary/Chest: Effort normal and breath sounds normal. No respiratory distress. He has no wheezes. He has no rales. He exhibits no tenderness.  Abdominal: Soft. Bowel sounds are normal. He exhibits no distension. There is no tenderness.  Musculoskeletal: Normal range of motion. He exhibits no edema and no tenderness.  Lymphadenopathy:    He has no cervical adenopathy.  Neurological: He is alert and oriented to person, place, and time. Coordination normal.  Skin: Skin is warm and dry. No rash noted. No erythema.  Psychiatric: He has a normal mood and affect. His behavior is normal. Judgment and thought content normal.      Assessment and Plan

## 2013-07-16 NOTE — Patient Instructions (Addendum)
You are doing well. Please start 1/2 pravastatin dose every other day for one to two weeks The 1/2 pill daily for 2 weeks Then if tolerating ok, Increase up to a full pill daily  Please call us if you have new issues that need to be addressed before your next appt.  Your physician wants you to follow-up in: 6 months.  You will receive a reminder letter in the mail two months in advance. If you don't receive a letter, please call our office to schedule the follow-up appointment.

## 2013-08-03 ENCOUNTER — Encounter: Payer: Self-pay | Admitting: Family Medicine

## 2013-08-04 ENCOUNTER — Ambulatory Visit: Payer: Managed Care, Other (non HMO) | Admitting: Psychology

## 2013-08-18 ENCOUNTER — Other Ambulatory Visit: Payer: Self-pay | Admitting: *Deleted

## 2013-08-18 MED ORDER — VENLAFAXINE HCL ER 150 MG PO CP24: 150.0000 mg | ORAL_CAPSULE | Freq: Two times a day (BID) | ORAL | Status: DC

## 2013-08-26 ENCOUNTER — Other Ambulatory Visit: Payer: Self-pay | Admitting: *Deleted

## 2013-08-26 MED ORDER — ZOLPIDEM TARTRATE 10 MG PO TABS: 10.0000 mg | ORAL_TABLET | Freq: Every evening | ORAL | Status: DC | PRN

## 2013-08-26 NOTE — Telephone Encounter (Signed)
Jordan Mcgrath left v/m requesting cb today about status of refill zolpidem. Spoke with Jordan Weakland and Jordan Mcgrath is out of medication and request refill done today.Please advise.

## 2013-08-26 NOTE — Telephone Encounter (Signed)
Last filled 07/27/13

## 2013-08-26 NOTE — Telephone Encounter (Signed)
Rx called in as directed.   

## 2013-08-26 NOTE — Telephone Encounter (Signed)
plz phone in. 

## 2013-09-23 ENCOUNTER — Other Ambulatory Visit: Payer: Self-pay | Admitting: *Deleted

## 2013-09-23 DIAGNOSIS — M545 Low back pain: Secondary | ICD-10-CM

## 2013-09-23 MED ORDER — TRAMADOL HCL 50 MG PO TABS: 50.0000 mg | ORAL_TABLET | Freq: Two times a day (BID) | ORAL | Status: DC | PRN

## 2013-09-23 NOTE — Telephone Encounter (Signed)
Last filled 08/06/13

## 2013-09-23 NOTE — Telephone Encounter (Signed)
Okay #[phone] x 0 

## 2013-09-23 NOTE — Telephone Encounter (Signed)
rx called into pharmacy

## 2013-09-29 ENCOUNTER — Other Ambulatory Visit: Payer: Self-pay | Admitting: *Deleted

## 2013-09-29 MED ORDER — AMIODARONE HCL 200 MG PO TABS: 200.0000 mg | ORAL_TABLET | Freq: Every day | ORAL | Status: DC

## 2013-09-29 MED ORDER — TORSEMIDE 20 MG PO TABS: ORAL_TABLET | ORAL | Status: DC

## 2013-09-29 NOTE — Telephone Encounter (Signed)
Refilled Amiodarone and Torsemide sent to [facility] pharmacy.

## 2013-10-05 ENCOUNTER — Other Ambulatory Visit: Payer: Self-pay | Admitting: *Deleted

## 2013-10-05 MED ORDER — FLUTICASONE PROPIONATE 50 MCG/ACT NA SUSP: 2.0000 | Freq: Every day | NASAL | Status: AC

## 2013-11-05 ENCOUNTER — Other Ambulatory Visit: Payer: Self-pay

## 2013-11-06 ENCOUNTER — Other Ambulatory Visit: Payer: Self-pay | Admitting: *Deleted

## 2013-11-06 DIAGNOSIS — M545 Low back pain: Secondary | ICD-10-CM

## 2013-11-06 NOTE — Telephone Encounter (Signed)
Last filled 09/23/13

## 2013-11-08 MED ORDER — TRAMADOL HCL 50 MG PO TABS: 50.0000 mg | ORAL_TABLET | Freq: Two times a day (BID) | ORAL | Status: DC | PRN

## 2013-11-08 NOTE — Telephone Encounter (Signed)
plz phone in. 

## 2013-11-09 NOTE — Telephone Encounter (Signed)
Rx called in as directed.   

## 2013-11-13 ENCOUNTER — Encounter: Payer: Self-pay | Admitting: Radiology

## 2013-11-16 ENCOUNTER — Ambulatory Visit (INDEPENDENT_AMBULATORY_CARE_PROVIDER_SITE_OTHER): Payer: Medicare Other | Admitting: Family Medicine

## 2013-11-16 ENCOUNTER — Encounter: Payer: Self-pay | Admitting: Family Medicine

## 2013-11-16 VITALS — BP 132/84 | HR 80 | Temp 98.0°F | Wt 325.2 lb

## 2013-11-16 DIAGNOSIS — Z9884 Bariatric surgery status: Secondary | ICD-10-CM

## 2013-11-16 DIAGNOSIS — E785 Hyperlipidemia, unspecified: Secondary | ICD-10-CM

## 2013-11-16 DIAGNOSIS — N2889 Other specified disorders of kidney and ureter: Secondary | ICD-10-CM

## 2013-11-16 DIAGNOSIS — F331 Major depressive disorder, recurrent, moderate: Secondary | ICD-10-CM

## 2013-11-16 DIAGNOSIS — M545 Low back pain: Secondary | ICD-10-CM

## 2013-11-16 DIAGNOSIS — F1010 Alcohol abuse, uncomplicated: Secondary | ICD-10-CM

## 2013-11-16 DIAGNOSIS — N289 Disorder of kidney and ureter, unspecified: Secondary | ICD-10-CM

## 2013-11-16 DIAGNOSIS — K2970 Gastritis, unspecified, without bleeding: Secondary | ICD-10-CM | POA: Insufficient documentation

## 2013-11-16 DIAGNOSIS — Z7289 Other problems related to lifestyle: Secondary | ICD-10-CM

## 2013-11-16 MED ORDER — DIGOXIN 250 MCG PO TABS: 250.0000 ug | ORAL_TABLET | Freq: Every day | ORAL | Status: DC

## 2013-11-16 MED ORDER — SERTRALINE HCL 25 MG PO TABS: 50.0000 mg | ORAL_TABLET | Freq: Every day | ORAL | Status: DC

## 2013-11-16 MED ORDER — OMEPRAZOLE 40 MG PO CPDR: 40.0000 mg | DELAYED_RELEASE_CAPSULE | Freq: Every day | ORAL | Status: DC

## 2013-11-16 MED ORDER — VENLAFAXINE HCL ER 75 MG PO CP24: 75.0000 mg | ORAL_CAPSULE | Freq: Every day | ORAL | Status: DC

## 2013-11-16 MED ORDER — FENOFIBRATE 145 MG PO TABS: 145.0000 mg | ORAL_TABLET | Freq: Every day | ORAL | Status: DC

## 2013-11-16 MED ORDER — ZOLPIDEM TARTRATE 10 MG PO TABS: 10.0000 mg | ORAL_TABLET | Freq: Every evening | ORAL | Status: DC | PRN

## 2013-11-16 MED ORDER — METOPROLOL TARTRATE 25 MG PO TABS: 25.0000 mg | ORAL_TABLET | Freq: Two times a day (BID) | ORAL | Status: DC

## 2013-11-16 NOTE — Assessment & Plan Note (Signed)
Pt saw Dr. Laymond Purser - who suggested trial of different antidepressant  Will cross taper from effexor (longterm use) to sertraline.  See pt instructions. rtc 1 mo for f/u.

## 2013-11-16 NOTE — Assessment & Plan Note (Signed)
Will continue to follow with [facility].

## 2013-11-16 NOTE — Progress Notes (Signed)
Pre-visit discussion using our clinic review tool. No additional management support is needed unless otherwise documented below in the visit note.  

## 2013-11-16 NOTE — Patient Instructions (Addendum)
Let's decrease omeprazole to once a day. Increase pravastatin to 20mg  once daily. Let's slowly cross taper off effexor - will do trial of sertraline or zoloft: Take effexor 150mg  at bedtime, start sertraline 25mg  in the morning, after 2 weeks increase sertraline to 50mg  in the morning and decrease effexor to 75 mg at bedtime.  I've sent in new prescription for sertraline 25mg  and effexor 75mg . Return in 1 month for depression medicine change follow up

## 2013-11-16 NOTE — Assessment & Plan Note (Signed)
By recent EGD - recommended continued omeprazole (esp in setting of continued EtOH use)

## 2013-11-16 NOTE — Assessment & Plan Note (Signed)
1 pint hard liquor per day. Again discussed relationship between EtOH and depression. Will discuss trial of topamax in future.

## 2013-11-16 NOTE — Progress Notes (Signed)
  Subjective:    Patient ID: Jordan Mcgrath, male    DOB: 1941/05/11, 72 y.o.   MRN: [id number]  HPI CC: 6 mo f/u  Pleasant 72 yo with h/o OSA on nightly CPAP, morbid obesity s/p gastric bypass 2008, allergic rhinitis, chronic combined systolic and diastolic CHF, pulm HTN, asthma, h/o afib and recent dx renal mass followed by Dr. Patsi Sears of [facility].  Given stability of mass on 2 recent CT scans, urology plans on repeating CT scan with IV contrast 06/2014 - and if remaining stable, will just monitor with ultrasounds.  Continues drinking 1 pint a day. HLD - on pravastatin 10mg  daily per cardiology. Saw psychology - recommended trial of another antidepressant.  Recent GI workup for abd pain and concern for GIB returned overall normal (stable CT scan and EGD).  Omeprazole 40mg  bid was started during workup.   Past Medical History  Diagnosis Date  . Systolic and diastolic CHF, chronic 10/2011    EF 45%; mod-severe MR  . History of acute renal failure 09/2010  . OSA on CPAP     Auto, on 7 usually  . Morbid obesity     s/p gastric bypass 2008  . Allergic rhinitis   . Depression     verified-on effexor 2 times a day  . PE (pulmonary embolism) 2007    h/o PE x1- completed coumadin course  . Asthma childhood  . History of gastroesophageal reflux (GERD)     improved after bypass  . History of diabetes mellitus     improved after bypass  . HLD (hyperlipidemia)     improved after bypass  . HTN (hypertension)     improved after bypass  . Alcohol use   . Atrial fibrillation 10/30/2010    hx  . Right kidney mass 2014    no change, so monitoring yearly per Dr. Patsi Sears  . Gallstones      Review of Systems Per HPI    Objective:   Physical Exam  Nursing note and vitals reviewed. Constitutional: He appears well-developed and well-nourished. No distress.  obese  HENT:  Mouth/Throat: Oropharynx is clear and moist. No oropharyngeal exudate.  Cardiovascular: Normal rate,  regular rhythm, normal heart sounds and intact distal pulses.   No murmur heard. Pulmonary/Chest: Effort normal and breath sounds normal. No respiratory distress. He has no wheezes. He has no rales.  Musculoskeletal: He exhibits no edema.  Skin: Skin is warm and dry. No rash noted.       Assessment & Plan:

## 2013-11-16 NOTE — Assessment & Plan Note (Signed)
Reports compliance with vitamin supplementation.

## 2013-11-16 NOTE — Assessment & Plan Note (Signed)
Prior statins caused myalgias. Discussed increase pravastatin to 20mg  daily as long as tolerating well

## 2013-12-15 ENCOUNTER — Encounter: Payer: Self-pay | Admitting: Radiology

## 2013-12-16 ENCOUNTER — Ambulatory Visit (INDEPENDENT_AMBULATORY_CARE_PROVIDER_SITE_OTHER): Payer: Medicare Other | Admitting: Family Medicine

## 2013-12-16 ENCOUNTER — Encounter: Payer: Self-pay | Admitting: Family Medicine

## 2013-12-16 VITALS — BP 124/76 | HR 72 | Temp 98.0°F | Wt 329.8 lb

## 2013-12-16 DIAGNOSIS — G4733 Obstructive sleep apnea (adult) (pediatric): Secondary | ICD-10-CM

## 2013-12-16 DIAGNOSIS — F1010 Alcohol abuse, uncomplicated: Secondary | ICD-10-CM

## 2013-12-16 DIAGNOSIS — F331 Major depressive disorder, recurrent, moderate: Secondary | ICD-10-CM

## 2013-12-16 DIAGNOSIS — Z7289 Other problems related to lifestyle: Secondary | ICD-10-CM

## 2013-12-16 MED ORDER — SERTRALINE HCL 100 MG PO TABS: 100.0000 mg | ORAL_TABLET | Freq: Every day | ORAL | Status: DC

## 2013-12-16 MED ORDER — TOPIRAMATE 50 MG PO TABS: 50.0000 mg | ORAL_TABLET | Freq: Every day | ORAL | Status: DC

## 2013-12-16 MED ORDER — VENLAFAXINE HCL ER 37.5 MG PO CP24: 37.5000 mg | ORAL_CAPSULE | Freq: Every day | ORAL | Status: DC

## 2013-12-16 NOTE — Assessment & Plan Note (Signed)
Pt has not noted any change in mood. Will continue cross taper off effexor - see pt instructions. increase sertraline to 100mg  daily.

## 2013-12-16 NOTE — Progress Notes (Signed)
Pre-visit discussion using our clinic review tool. No additional management support is needed unless otherwise documented below in the visit note.  

## 2013-12-16 NOTE — Progress Notes (Signed)
   Subjective:    Patient ID: Jordan Mcgrath, male    DOB: 1941/01/13, 72 y.o.   MRN: [id number]  HPI CC: 1 mo f/u  Pleasant 72 yo with h/o OSA on nightly CPAP, morbid obesity s/p gastric bypass 2008, allergic rhinitis, chronic combined systolic and diastolic CHF, pulm HTN, asthma, h/o afib and recent dx renal mass followed by Dr. Patsi Sears of [facility].   Per Dr. Veatrice Kells recommendation we cross tapered off effexor and onto sertraline.  Currently taking effexor 75mg  XR nightly and sertraline 50mg  once daily.  Hasn't noticed any side effects, but also hasn't noticed any real change in mood.  Denies headaches, nausea, abd pain, appetite changes, trouble sleeping. Wt Readings from Last 3 Encounters:  12/16/13 329 lb 12 oz (149.574 kg)  11/16/13 325 lb 4 oz (147.532 kg)  07/16/13 314 lb 8 oz (142.656 kg)   Discussed topamax.   Past Medical History  Diagnosis Date  . Systolic and diastolic CHF, chronic 10/2011    EF 45%; mod-severe MR  . History of acute renal failure 09/2010  . OSA on CPAP     Auto, on 7 usually  . Morbid obesity     s/p gastric bypass 2008  . Allergic rhinitis   . Depression     verified-on effexor 2 times a day  . PE (pulmonary embolism) 2007    h/o PE x1- completed coumadin course  . Asthma childhood  . History of gastroesophageal reflux (GERD)     improved after bypass  . History of diabetes mellitus     improved after bypass  . HLD (hyperlipidemia)     improved after bypass  . HTN (hypertension)     improved after bypass  . Alcohol use   . Atrial fibrillation 10/30/2010    hx  . Right kidney mass 2014    no change, so monitoring yearly per Dr. Patsi Sears  . Gallstones      Review of Systems Per HPI    Objective:   Physical Exam     Assessment & Plan:

## 2013-12-16 NOTE — Assessment & Plan Note (Signed)
1 pint liquor daily. discussed topamax use including common side effects Pt interested in trial of this See pt instructions. rtc 2-3 months for f/u.

## 2013-12-16 NOTE — Patient Instructions (Addendum)
Let's decrease effexor to 37.5mg  XR nightly for 2 weeks then stop. Increase sertraline to 100mg  daily in the morning. After you've completed effexor, may do trial of topamax 50mg  nightly. This may help with appetite and with alcohol. Return to see me as needed or in 2-3 months for follow up

## 2013-12-16 NOTE — Assessment & Plan Note (Signed)
Script for new CPAP mask/tubing provided today.

## 2014-01-04 ENCOUNTER — Other Ambulatory Visit: Payer: Self-pay | Admitting: *Deleted

## 2014-01-04 DIAGNOSIS — M545 Low back pain, unspecified: Secondary | ICD-10-CM

## 2014-01-04 MED ORDER — TRAMADOL HCL 50 MG PO TABS: 50.0000 mg | ORAL_TABLET | Freq: Two times a day (BID) | ORAL | Status: DC | PRN

## 2014-01-04 NOTE — Telephone Encounter (Signed)
Last filled 11/09/13

## 2014-01-04 NOTE — Telephone Encounter (Signed)
rx called into pharmacy

## 2014-01-04 NOTE — Telephone Encounter (Signed)
Okay #[phone] x 0 

## 2014-01-15 ENCOUNTER — Ambulatory Visit: Payer: Medicare Other | Admitting: Cardiovascular Disease

## 2014-01-20 ENCOUNTER — Other Ambulatory Visit: Payer: Self-pay | Admitting: *Deleted

## 2014-01-20 ENCOUNTER — Ambulatory Visit: Payer: Medicare Other | Admitting: Cardiovascular Disease

## 2014-01-20 MED ORDER — TORSEMIDE 20 MG PO TABS: ORAL_TABLET | ORAL | Status: DC

## 2014-01-20 NOTE — Telephone Encounter (Signed)
Requested Prescriptions   Signed Prescriptions Disp Refills  . torsemide (DEMADEX) 20 MG tablet 90 tablet 3    Sig: Take two tablets in the morning and one tablet in the evening for heart.    Authorizing Provider: Antonieta IbaGOLLAN, TIMOTHY J    Ordering User: Kendrick FriesLOPEZ, [place] Cantero C

## 2014-01-28 ENCOUNTER — Encounter: Payer: Self-pay | Admitting: Cardiovascular Disease

## 2014-01-28 ENCOUNTER — Ambulatory Visit (INDEPENDENT_AMBULATORY_CARE_PROVIDER_SITE_OTHER): Payer: Medicare Other | Admitting: Cardiovascular Disease

## 2014-01-28 VITALS — BP 118/82 | HR 65 | Ht 72.0 in | Wt 328.2 lb

## 2014-01-28 DIAGNOSIS — E785 Hyperlipidemia, unspecified: Secondary | ICD-10-CM

## 2014-01-28 DIAGNOSIS — N2889 Other specified disorders of kidney and ureter: Secondary | ICD-10-CM

## 2014-01-28 DIAGNOSIS — I509 Heart failure, unspecified: Secondary | ICD-10-CM

## 2014-01-28 DIAGNOSIS — J019 Acute sinusitis, unspecified: Secondary | ICD-10-CM | POA: Insufficient documentation

## 2014-01-28 DIAGNOSIS — N289 Disorder of kidney and ureter, unspecified: Secondary | ICD-10-CM

## 2014-01-28 DIAGNOSIS — I4891 Unspecified atrial fibrillation: Secondary | ICD-10-CM

## 2014-01-28 DIAGNOSIS — I5042 Chronic combined systolic (congestive) and diastolic (congestive) heart failure: Secondary | ICD-10-CM

## 2014-01-28 MED ORDER — LEVOFLOXACIN 500 MG PO TABS: 500.0000 mg | ORAL_TABLET | Freq: Every day | ORAL | Status: DC

## 2014-01-28 NOTE — Assessment & Plan Note (Signed)
[place]. Notes indicate periodic imaging/surveillance

## 2014-01-28 NOTE — Assessment & Plan Note (Signed)
We have encouraged continued exercise, careful diet management in an effort to lose weight. 

## 2014-01-28 NOTE — Assessment & Plan Note (Signed)
Encouraged him to stay on his fenofibrate and pravastatin

## 2014-01-28 NOTE — Assessment & Plan Note (Addendum)
Symptoms over the past month. Given the persistent symptoms, we will prescribe him Levaquin 500 mg for at least 10 days. 14 days provided. Suggested he schedule a visit with Dr. Sharen HonesGutierrez
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
## 2014-01-28 NOTE — Patient Instructions (Signed)
Please take levaquin one a day for at least 10 days We will check blood work today  Please call us if you have new issues that need to be addressed before your next appt.  Your physician wants you to follow-up in: 6 months.  You will receive a reminder letter in the mail two months in advance. If you don't receive a letter, please call our office to schedule the follow-up appointment.

## 2014-01-28 NOTE — Assessment & Plan Note (Signed)
Appears to be maintaining normal sinus rhythm. No changes to his medications 

## 2014-01-28 NOTE — Assessment & Plan Note (Signed)
Appears relatively euvolemic on today's visit. No changes to his medications 

## 2014-01-29 LAB — LIPID PANEL
CHOLESTEROL TOTAL: 212 mg/dL — AB (ref 100–199)
Chol/HDL Ratio: 4.8 ratio units (ref 0.0–5.0)
HDL: 44 mg/dL (ref >39)
LDL Calculated: 113 mg/dL — ABNORMAL HIGH (ref 0–99)
Triglycerides: 277 mg/dL — ABNORMAL HIGH (ref 0–149)
VLDL Cholesterol Cal: 55 mg/dL — ABNORMAL HIGH (ref 5–40)

## 2014-01-29 LAB — HEPATIC FUNCTION PANEL
ALBUMIN: 4.4 g/dL (ref 3.5–4.8)
ALK PHOS: 57 IU/L (ref 39–117)
ALT: 15 IU/L (ref 0–44)
AST: 22 IU/L (ref 0–40)
BILIRUBIN TOTAL: 0.4 mg/dL (ref 0.0–1.2)
Bilirubin, Direct: 0.13 mg/dL (ref 0.00–0.40)
Total Protein: 6.8 g/dL (ref 6.0–8.5)

## 2014-01-29 LAB — TSH: TSH: 1.68 u[IU]/mL (ref 0.450–4.500)

## 2014-02-04 ENCOUNTER — Other Ambulatory Visit: Payer: Self-pay

## 2014-02-04 MED ORDER — PRAVASTATIN SODIUM 40 MG PO TABS: 20.0000 mg | ORAL_TABLET | Freq: Every day | ORAL | Status: DC

## 2014-02-15 ENCOUNTER — Other Ambulatory Visit: Payer: Self-pay | Admitting: *Deleted

## 2014-02-15 MED ORDER — TORSEMIDE 20 MG PO TABS: ORAL_TABLET | ORAL | Status: DC

## 2014-02-15 NOTE — Telephone Encounter (Signed)
Requested Prescriptions   Signed Prescriptions Disp Refills  . torsemide (DEMADEX) 20 MG tablet 90 tablet 3    Sig: Take two tablets in the morning and one tablet in the evening for heart.    Authorizing Provider: Antonieta IbaGOLLAN, TIMOTHY J    Ordering User: Kendrick FriesLOPEZ, Halsey Hammen C

## 2014-02-18 ENCOUNTER — Other Ambulatory Visit: Payer: Self-pay

## 2014-02-18 ENCOUNTER — Other Ambulatory Visit: Payer: Self-pay | Admitting: *Deleted

## 2014-02-18 MED ORDER — PRAVASTATIN SODIUM 40 MG PO TABS: 40.0000 mg | ORAL_TABLET | Freq: Every day | ORAL | Status: DC

## 2014-02-18 MED ORDER — PRAVASTATIN SODIUM 40 MG PO TABS: 20.0000 mg | ORAL_TABLET | Freq: Every day | ORAL | Status: DC

## 2014-02-18 NOTE — Telephone Encounter (Signed)
Please call patient as he needs the new rx orders on Pravastatin.

## 2014-02-18 NOTE — Telephone Encounter (Signed)
Please see

## 2014-02-22 ENCOUNTER — Telehealth: Payer: Self-pay

## 2014-02-22 NOTE — Telephone Encounter (Signed)
Pt has question regarding his prescription Pravastatin.Please call.

## 2014-02-22 NOTE — Telephone Encounter (Signed)
Spoke w/ pt.  Clarified that he is to take pravastatin 40 mg daily and I will send in a new rx for this.  Pt is agreeable to this and will call w/ any other questions or concerns.

## 2014-03-15 ENCOUNTER — Other Ambulatory Visit: Payer: Self-pay | Admitting: *Deleted

## 2014-03-15 MED ORDER — ZOLPIDEM TARTRATE 10 MG PO TABS: 10.0000 mg | ORAL_TABLET | Freq: Every evening | ORAL | Status: DC | PRN

## 2014-03-15 NOTE — Telephone Encounter (Signed)
Pt requesting medication refill. Last ov 11/2013 with f/u appt sched tomorrow 3/17. pls advise

## 2014-03-15 NOTE — Telephone Encounter (Signed)
Rx called in as directed.   

## 2014-03-15 NOTE — Telephone Encounter (Signed)
plz phone in. 

## 2014-03-16 ENCOUNTER — Encounter: Payer: Self-pay | Admitting: Family Medicine

## 2014-03-16 ENCOUNTER — Ambulatory Visit (INDEPENDENT_AMBULATORY_CARE_PROVIDER_SITE_OTHER): Payer: Medicare Other | Admitting: Family Medicine

## 2014-03-16 VITALS — BP 126/76 | HR 72 | Temp 98.1°F | Wt 331.5 lb

## 2014-03-16 DIAGNOSIS — J309 Allergic rhinitis, unspecified: Secondary | ICD-10-CM

## 2014-03-16 DIAGNOSIS — E785 Hyperlipidemia, unspecified: Secondary | ICD-10-CM

## 2014-03-16 DIAGNOSIS — I5042 Chronic combined systolic (congestive) and diastolic (congestive) heart failure: Secondary | ICD-10-CM

## 2014-03-16 DIAGNOSIS — F1090 Alcohol use, unspecified, uncomplicated: Secondary | ICD-10-CM

## 2014-03-16 DIAGNOSIS — Z7289 Other problems related to lifestyle: Secondary | ICD-10-CM

## 2014-03-16 DIAGNOSIS — Z23 Encounter for immunization: Secondary | ICD-10-CM

## 2014-03-16 DIAGNOSIS — I509 Heart failure, unspecified: Secondary | ICD-10-CM

## 2014-03-16 DIAGNOSIS — F1010 Alcohol abuse, uncomplicated: Secondary | ICD-10-CM

## 2014-03-16 DIAGNOSIS — F331 Major depressive disorder, recurrent, moderate: Secondary | ICD-10-CM

## 2014-03-16 MED ORDER — MONTELUKAST SODIUM 10 MG PO TABS: 10.0000 mg | ORAL_TABLET | Freq: Every day | ORAL | Status: DC

## 2014-03-16 NOTE — Progress Notes (Signed)
Pre visit review using our clinic review tool, if applicable. No additional management support is needed unless otherwise documented below in the visit note. 

## 2014-03-16 NOTE — Assessment & Plan Note (Signed)
topamax has not helped.  rec stop this med, may retrial in future. Anticipate increased family stressors contributing to more difficulty with cutting back on EtOH. 1 pint liquor daily continued.

## 2014-03-16 NOTE — Progress Notes (Signed)
BP 126/76  Pulse 72  Temp(Src) 98.1 F (36.7 C) (Oral)  Wt 331 lb 8 oz (150.367 kg)   CC: 3 mo f/u  Subjective:    Patient ID: Jordan Mcgrath, male    DOB: [birth year], 73 y.o.   MRN: [id number]  HPI: Heyward Douthit is a 73 y.o. male presenting on 03/16/2014 for Follow-up   Pleasant 73 yo with h/o OSA on nightly CPAP, morbid obesity s/p gastric bypass 2008, allergic rhinitis, chronic combined systolic and diastolic CHF, pulm HTN, asthma, h/o afib and recent dx renal mass followed by Dr. Patsi Sears of [facility].   Significant congestion over last 3 months.  Only taking OTC remedies - zyrtec and flonase not helping.  He was treated with 2 wk course levaquin by cardiology.  Blowing nose with thick cream and bloody mucous.  Initially with headaches, now improving but persistent chest congestion.  Sneezing has progressed to cough.  No PNDrainage. No fevers. No body aches, fatigue.  Presents today for 3 mo f/u.  Last visit we were in process of tapering off long term effexor and onto sertraline.  Currently on sertraline 100mg  daily. Feels sertraline 100mg  overall ok - but no significant change in mood from effexor. For EtOH use (was drinking 1 pint/day) - we had decided to do a trial of topamax.  Pt has been taking 50mg  nightly and hasn't noticed change in drinking habits - still drinking about 1 pint.  But he attributes this in part to significant stress (several family members and friends have died over the last few months - last sister and last brother in law).  Requests prevnar today.  Pneumovax done 2011.  Wt Readings from Last 3 Encounters:  03/16/14 331 lb 8 oz (150.367 kg)  01/28/14 328 lb 4 oz (148.893 kg)  12/16/13 329 lb 12 oz (149.574 kg)  Body mass index is 44.95 kg/(m^2).  Relevant past medical, surgical, family and social history reviewed and updated as indicated.  Allergies and medications reviewed and updated. Current Outpatient Prescriptions on File Prior to Visit    Medication Sig  . amiodarone (PACERONE) 200 MG tablet Take 1 tablet (200 mg total) by mouth daily.  Marland Kitchen aspirin 81 MG tablet Take 81 mg by mouth daily.    . Calcium Carbonate-Vitamin D (CALCIUM 600/VITAMIN D) 600-400 MG-UNIT per tablet Take 2 tablets by mouth daily.  . cetirizine (ZYRTEC) 10 MG tablet Take 1 tablet (10 mg total) by mouth daily.  . Cyanocobalamin (B-12 DOTS) 500 MCG SUBL Place 1 tablet under the tongue every morning. 5000 mcg  . digoxin (LANOXIN) 0.25 MG tablet Take 1 tablet (250 mcg total) by mouth daily.  . ferrous sulfate 325 (65 FE) MG tablet Take 325 mg by mouth 2 (two) times daily. 2 hours before or after calcium  . fish oil-omega-3 fatty acids 1000 MG capsule Take 2 g by mouth daily.   . fluticasone (FLONASE) 50 MCG/ACT nasal spray Place 2 sprays into the nose daily.  . metoprolol tartrate (LOPRESSOR) 25 MG tablet Take 1 tablet (25 mg total) by mouth 2 (two) times daily.  . Multiple Vitamin (MULTIVITAMIN) tablet Take 1 tablet by mouth daily.    Marland Kitchen omeprazole (PRILOSEC) 40 MG capsule Take 1 capsule (40 mg total) by mouth daily.  . pravastatin (PRAVACHOL) 40 MG tablet Take 1 tablet (40 mg total) by mouth daily.  . sertraline (ZOLOFT) 100 MG tablet Take 1 tablet (100 mg total) by mouth daily.  Marland Kitchen torsemide (DEMADEX) 20 MG tablet  Take two tablets in the morning and one tablet in the evening for heart.  . traMADol (ULTRAM) 50 MG tablet Take 1 tablet (50 mg total) by mouth 2 (two) times daily as needed.  . zolpidem (AMBIEN) 10 MG tablet Take 1 tablet (10 mg total) by mouth at bedtime as needed.  . venlafaxine XR (EFFEXOR-XR) 37.5 MG 24 hr capsule Take 1 capsule (37.5 mg total) by mouth at bedtime. For 2 weeks then stop   No current facility-administered medications on file prior to visit.    Review of Systems Per HPI unless specifically indicated above    Objective:    BP 126/76  Pulse 72  Temp(Src) 98.1 F (36.7 C) (Oral)  Wt 331 lb 8 oz (150.367 kg)  Physical Exam   Nursing note and vitals reviewed. Constitutional: He appears well-developed and well-nourished. No distress.  Morbidly obese  HENT:  Head: Normocephalic and atraumatic.  Right Ear: Hearing, tympanic membrane, external ear and ear canal normal.  Left Ear: Hearing, tympanic membrane, external ear and ear canal normal.  Nose: Mucosal edema present. No rhinorrhea. Right sinus exhibits no maxillary sinus tenderness and no frontal sinus tenderness. Left sinus exhibits no maxillary sinus tenderness and no frontal sinus tenderness.  Mouth/Throat: Uvula is midline, oropharynx is clear and moist and mucous membranes are normal. No oropharyngeal exudate, posterior oropharyngeal edema, posterior oropharyngeal erythema or tonsillar abscesses.  Oropharyngeal cobblestoning  Eyes: Conjunctivae and EOM are normal. Pupils are equal, round, and reactive to light. No scleral icterus.  Neck: Normal range of motion. Neck supple.  Cardiovascular: Normal rate, regular rhythm, normal heart sounds and intact distal pulses.   No murmur heard. Pulmonary/Chest: Effort normal and breath sounds normal. No respiratory distress. He has no wheezes. He has no rales.  Lymphadenopathy:    He has no cervical adenopathy.  Skin: Skin is warm and dry. No rash noted.      Assessment & Plan:   Problem List Items Addressed This Visit   ALLERGIC RHINITIS     Anticipate allergic rhinitis predominant congestion. S/p 2wk levaquin course, currently no evidence of persistent infection. Continue zyrtec and flonase. Start singulair once daily - trial of this for next month to monitor effect    Chronic combined systolic and diastolic CHF (congestive heart failure)     Chronic, stable.    Relevant Medications      fenofibrate (TRICOR) tablet   DEPRESSION, MAJOR, RECURRENT, MODERATE     Chronic, stable.   Tolerated cross taper off effexor, will continue to monitor without changes today.    Dyslipidemia     Recent increase in  pravastatin to 40mg  daily, continues fenofibrate as well.  Not due for FLP yet - will recheck at next office visit.    Habitual alcohol use - Primary     topamax has not helped.  rec stop this med, may retrial in future. Anticipate increased family stressors contributing to more difficulty with cutting back on EtOH. 1 pint liquor daily continued.        Follow up plan: Return in about 3 months (around 06/16/2014), or if symptoms worsen or fail to improve, for medicare wellness visit.

## 2014-03-16 NOTE — Patient Instructions (Addendum)
Prevnar today. Let's stop topamax for now, but we do have the option of restarting in the future as another trial. For allergic congestion - start singulair - 1 month trial of this medicine.  If effective, may continue. Continue sertraline at 100mg  daily. Return to see me in 3-4 months for wellness visit, prior fasting for blood work.

## 2014-03-16 NOTE — Assessment & Plan Note (Signed)
Recent increase in pravastatin to 40mg  daily, continues fenofibrate as well.  Not due for FLP yet - will recheck at next office visit.

## 2014-03-16 NOTE — Assessment & Plan Note (Signed)
Chronic, stable.   Tolerated cross taper off effexor, will continue to monitor without changes today.

## 2014-03-16 NOTE — Assessment & Plan Note (Signed)
Anticipate allergic rhinitis predominant congestion. S/p 2wk levaquin course, currently no evidence of persistent infection. Continue zyrtec and flonase. Start singulair once daily - trial of this for next month to monitor effect

## 2014-03-16 NOTE — Addendum Note (Signed)
Addended by: Josph MachoANCE, KIMBERLY A on: 03/16/2014 09:16 AM   Modules accepted: Orders

## 2014-03-16 NOTE — Assessment & Plan Note (Signed)
Chronic, stable 

## 2014-04-08 ENCOUNTER — Telehealth: Payer: Self-pay

## 2014-04-08 MED ORDER — MONTELUKAST SODIUM 10 MG PO TABS: 10.0000 mg | ORAL_TABLET | Freq: Every day | ORAL | Status: AC

## 2014-04-08 NOTE — Addendum Note (Signed)
Addended by: Eustaquio BoydenGUTIERREZ, Flower Franko on: 04/08/2014 02:06 PM   Modules accepted: Orders

## 2014-04-08 NOTE — Telephone Encounter (Signed)
Pt left v/m; pt seen 03/16/14 and wanted Dr Reece AgarG to know Singulair has worked great and pt would like to continue Singulair since pollen and allergy season is here; pt request refill Singulair to [facility]. Pt request cb. Spoke with pt; Dr Reece AgarG put refills on med. Pt said he did not pay attention there were refills and pt will contact medicap.

## 2014-04-08 NOTE — Telephone Encounter (Signed)
Sent in more refills

## 2014-05-03 ENCOUNTER — Other Ambulatory Visit: Payer: Self-pay

## 2014-05-03 MED ORDER — AMIODARONE HCL 200 MG PO TABS: 200.0000 mg | ORAL_TABLET | Freq: Every day | ORAL | Status: DC

## 2014-05-03 NOTE — Telephone Encounter (Signed)
Refill sent for amiodarone 

## 2014-05-05 ENCOUNTER — Other Ambulatory Visit: Payer: Self-pay | Admitting: *Deleted

## 2014-05-05 DIAGNOSIS — M545 Low back pain, unspecified: Secondary | ICD-10-CM

## 2014-05-06 MED ORDER — TRAMADOL HCL 50 MG PO TABS: 50.0000 mg | ORAL_TABLET | Freq: Two times a day (BID) | ORAL | Status: DC | PRN

## 2014-05-06 NOTE — Telephone Encounter (Signed)
plz phone in. 

## 2014-05-06 NOTE — Telephone Encounter (Signed)
Rx called in as directed.   

## 2014-06-15 ENCOUNTER — Ambulatory Visit (INDEPENDENT_AMBULATORY_CARE_PROVIDER_SITE_OTHER): Payer: Medicare Other | Admitting: Family Medicine

## 2014-06-15 ENCOUNTER — Encounter: Payer: Self-pay | Admitting: Family Medicine

## 2014-06-15 VITALS — BP 118/80 | HR 64 | Temp 98.6°F | Ht 72.0 in | Wt 337.0 lb

## 2014-06-15 DIAGNOSIS — J309 Allergic rhinitis, unspecified: Secondary | ICD-10-CM

## 2014-06-15 DIAGNOSIS — G4700 Insomnia, unspecified: Secondary | ICD-10-CM

## 2014-06-15 DIAGNOSIS — Z125 Encounter for screening for malignant neoplasm of prostate: Secondary | ICD-10-CM

## 2014-06-15 DIAGNOSIS — S30861A Insect bite (nonvenomous) of abdominal wall, initial encounter: Secondary | ICD-10-CM

## 2014-06-15 DIAGNOSIS — I509 Heart failure, unspecified: Secondary | ICD-10-CM

## 2014-06-15 DIAGNOSIS — G4733 Obstructive sleep apnea (adult) (pediatric): Secondary | ICD-10-CM

## 2014-06-15 DIAGNOSIS — W57XXXA Bitten or stung by nonvenomous insect and other nonvenomous arthropods, initial encounter: Secondary | ICD-10-CM

## 2014-06-15 DIAGNOSIS — N2889 Other specified disorders of kidney and ureter: Secondary | ICD-10-CM

## 2014-06-15 DIAGNOSIS — Z0000 Encounter for general adult medical examination without abnormal findings: Secondary | ICD-10-CM

## 2014-06-15 DIAGNOSIS — N289 Disorder of kidney and ureter, unspecified: Secondary | ICD-10-CM

## 2014-06-15 DIAGNOSIS — F1010 Alcohol abuse, uncomplicated: Secondary | ICD-10-CM

## 2014-06-15 DIAGNOSIS — S30860A Insect bite (nonvenomous) of lower back and pelvis, initial encounter: Secondary | ICD-10-CM

## 2014-06-15 DIAGNOSIS — I5042 Chronic combined systolic (congestive) and diastolic (congestive) heart failure: Secondary | ICD-10-CM

## 2014-06-15 DIAGNOSIS — D696 Thrombocytopenia, unspecified: Secondary | ICD-10-CM

## 2014-06-15 DIAGNOSIS — I4891 Unspecified atrial fibrillation: Secondary | ICD-10-CM

## 2014-06-15 DIAGNOSIS — F1090 Alcohol use, unspecified, uncomplicated: Secondary | ICD-10-CM

## 2014-06-15 DIAGNOSIS — Z7289 Other problems related to lifestyle: Secondary | ICD-10-CM

## 2014-06-15 DIAGNOSIS — F331 Major depressive disorder, recurrent, moderate: Secondary | ICD-10-CM

## 2014-06-15 DIAGNOSIS — E785 Hyperlipidemia, unspecified: Secondary | ICD-10-CM

## 2014-06-15 LAB — COMPREHENSIVE METABOLIC PANEL
ALBUMIN: 4.1 g/dL (ref 3.5–5.2)
ALT: 21 U/L (ref 0–53)
AST: 23 U/L (ref 0–37)
Alkaline Phosphatase: 45 U/L (ref 39–117)
BUN: 19 mg/dL (ref 6–23)
CALCIUM: 9.3 mg/dL (ref 8.4–10.5)
CHLORIDE: 99 meq/L (ref 96–112)
CO2: 31 mEq/L (ref 19–32)
Creatinine, Ser: 1.1 mg/dL (ref 0.4–1.5)
GFR: 71.66 mL/min (ref >60.00)
GLUCOSE: 133 mg/dL — AB (ref 70–99)
POTASSIUM: 3.9 meq/L (ref 3.5–5.1)
SODIUM: 138 meq/L (ref 135–145)
TOTAL PROTEIN: 6.5 g/dL (ref 6.0–8.3)
Total Bilirubin: 0.7 mg/dL (ref 0.2–1.2)

## 2014-06-15 LAB — CBC WITH DIFFERENTIAL/PLATELET
Basophils Absolute: 0.1 10*3/uL (ref 0.0–0.1)
Basophils Relative: 0.6 % (ref 0.0–3.0)
EOS PCT: 4.5 % (ref 0.0–5.0)
Eosinophils Absolute: 0.4 10*3/uL (ref 0.0–0.7)
HCT: 44.1 % (ref 39.0–52.0)
Hemoglobin: 14.7 g/dL (ref 13.0–17.0)
LYMPHS PCT: 15.8 % (ref 12.0–46.0)
Lymphs Abs: 1.3 10*3/uL (ref 0.7–4.0)
MCHC: 33.4 g/dL (ref 30.0–36.0)
MCV: 96.9 fl (ref 78.0–100.0)
Monocytes Absolute: 0.8 10*3/uL (ref 0.1–1.0)
Monocytes Relative: 9.5 % (ref 3.0–12.0)
NEUTROS ABS: 5.9 10*3/uL (ref 1.4–7.7)
Neutrophils Relative %: 69.6 % (ref 43.0–77.0)
Platelets: 137 10*3/uL — ABNORMAL LOW (ref 150.0–400.0)
RBC: 4.55 Mil/uL (ref 4.22–5.81)
RDW: 13.1 % (ref 11.5–15.5)
WBC: 8.4 10*3/uL (ref 4.0–10.5)

## 2014-06-15 LAB — PSA, MEDICARE: PSA: 0.26 ng/ml (ref 0.10–4.00)

## 2014-06-15 LAB — LIPID PANEL
CHOL/HDL RATIO: 4
Cholesterol: 177 mg/dL (ref 0–200)
HDL: 39.5 mg/dL (ref >39.00)
LDL CALC: 86 mg/dL (ref 0–99)
NonHDL: 137.5
TRIGLYCERIDES: 257 mg/dL — AB (ref 0.0–149.0)
VLDL: 51.4 mg/dL — ABNORMAL HIGH (ref 0.0–40.0)

## 2014-06-15 MED ORDER — NORTRIPTYLINE HCL 25 MG PO CAPS: 25.0000 mg | ORAL_CAPSULE | ORAL | Status: DC

## 2014-06-15 MED ORDER — DOXYCYCLINE HYCLATE 100 MG PO CAPS: 100.0000 mg | ORAL_CAPSULE | Freq: Two times a day (BID) | ORAL | Status: DC

## 2014-06-15 NOTE — Assessment & Plan Note (Signed)
Continue current regimen - anticipate weather-related flare, [facility] and only go out first thing in am.

## 2014-06-15 NOTE — Assessment & Plan Note (Signed)
Continue f/u with urology  

## 2014-06-15 NOTE — Progress Notes (Signed)
BP 118/80  Pulse 64  Temp(Src) 98.6 F (37 C) (Tympanic)  Ht 6' (1.829 m)  Wt 337 lb (152.862 kg)  BMI 45.70 kg/m2  SpO2 96%   CC: medicare wellness   Subjective:    Patient ID: Jordan Mcgrath, male    DOB: March 12, 1941, 73 y.o.   MRN: [id number]  HPI: Jordan Mcgrath is a 73 y.o. male presenting on 06/15/2014 for Annual Exam   Pleasant 73 yo with h/o OSA on nightly CPAP, habitual alcohol use, morbid obesity s/p gastric bypass 2008, allergic rhinitis, chronic combined systolic and diastolic CHF, pulm HTN, asthma, h/o afib and recent dx renal mass followed by Dr. Patsi Sears of [facility].   EtOH - drinking continues, 1 pint/day. Topamax did not help decrease craving/intake. This in setting of significant family stressors. Sister passed away Apr 01, 2014. (several family members and friends have died over the last few months - last sister and last brother in law). Staying depressed.  Tick bite - ?head remains. Removed 1 wk ago, small rash around site. Itchy. No other tick born illness sxs  Nasal congestion - flonase not as effective. nasonex was too expensive. singulair worked well until last week, increased congestion mainly after noon. Trying to stay indoors as much as possible.  Wt Readings from Last 3 Encounters:  06/15/14 337 lb (152.862 kg)  03/16/14 331 lb 8 oz (150.367 kg)  01/28/14 328 lb 4 oz (148.893 kg)  Body mass index is 45.7 kg/(m^2).  Sees eye doctor regularly, last 10/2013. Denies hearing difficulties, but wife endorses. Hearing screen ok today. Has had a few mechanical falls. No injury.  Depression - significant. PHQ9 performed today. On zoloft 100mg  daily. Has seen psychiatrist in past. Saw Dr. Laymond Purser in past. Not interested in returning. No trouble sleeping as long as he takes [place]. + chronic bilateral side pains.  Preventative: COLONOSCOPY Date: 05/2009 2 polyps, small int hemorrhoids ([place] [name]), rec rpt 5 years Prostate always normal.  Flu shot last  year. prevnar 2014-04-01, pneumovax 09/2010 Tetanus 2013 zostavax 2011 Advanced directives: would want HCPOA to be wife or daughter. Does not want prolonged life support.   Lives with wife. Has 1 daughter, 1 son.  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  Edu: college  Activity: no regular exercise  Diet: good water, fruits/vegetables daily   Relevant past medical, surgical, family and social history reviewed and updated as indicated.  Allergies and medications reviewed and updated. Current Outpatient Prescriptions on File Prior to Visit  Medication Sig  . amiodarone (PACERONE) 200 MG tablet Take 1 tablet (200 mg total) by mouth daily.  Marland Kitchen aspirin 81 MG tablet Take 81 mg by mouth daily.    . Calcium Carbonate-Vitamin D (CALCIUM 600/VITAMIN D) 600-400 MG-UNIT per tablet Take 2 tablets by mouth daily.  . cetirizine (ZYRTEC) 10 MG tablet Take 1 tablet (10 mg total) by mouth daily.  . Cyanocobalamin (B-12 DOTS) 500 MCG SUBL Place 1 tablet under the tongue every morning. 5000 mcg  . digoxin (LANOXIN) 0.25 MG tablet Take 1 tablet (250 mcg total) by mouth daily.  . fenofibrate (TRICOR) 145 MG tablet Take 1 tablet (145 mg total) by mouth daily.  . ferrous sulfate 325 (65 FE) MG tablet Take 325 mg by mouth 2 (two) times daily. 2 hours before or  after calcium  . fish oil-omega-3 fatty acids 1000 MG capsule Take 2 g by mouth daily.   . fluticasone (FLONASE) 50 MCG/ACT nasal spray Place 2 sprays into the nose daily.  . metoprolol tartrate (LOPRESSOR) 25 MG tablet Take 1 tablet (25 mg total) by mouth 2 (two) times daily.  . montelukast (SINGULAIR) 10 MG tablet Take 1 tablet (10 mg total) by mouth at bedtime.  . Multiple Vitamin (MULTIVITAMIN) tablet Take 1 tablet by mouth daily.    Marland Kitchen. omeprazole  (PRILOSEC) 40 MG capsule Take 1 capsule (40 mg total) by mouth daily.  . pravastatin (PRAVACHOL) 40 MG tablet Take 1 tablet (40 mg total) by mouth daily.  . sertraline (ZOLOFT) 100 MG tablet Take 1 tablet (100 mg total) by mouth daily.  Marland Kitchen. torsemide (DEMADEX) 20 MG tablet Take two tablets in the morning and one tablet in the evening for heart.  . traMADol (ULTRAM) 50 MG tablet Take 1 tablet (50 mg total) by mouth 2 (two) times daily as needed.  . zolpidem (AMBIEN) 10 MG tablet Take 1 tablet (10 mg total) by mouth at bedtime as needed.   No current facility-administered medications on file prior to visit.    Review of Systems Per HPI unless specifically indicated above    Objective:    BP 118/80  Pulse 64  Temp(Src) 98.6 F (37 C) (Tympanic)  Ht 6' (1.829 m)  Wt 337 lb (152.862 kg)  BMI 45.70 kg/m2  SpO2 96%  Physical Exam  Nursing note and vitals reviewed. Constitutional: He is oriented to person, place, and time. He appears well-developed and well-nourished. No distress.  Morbidly obese  HENT:  Head: Normocephalic and atraumatic.  Right Ear: Hearing, tympanic membrane, external ear and ear canal normal.  Left Ear: Hearing, tympanic membrane, external ear and ear canal normal.  Nose: Nose normal.  Mouth/Throat: Uvula is midline, oropharynx is clear and moist and mucous membranes are normal. No oropharyngeal exudate, posterior oropharyngeal edema or posterior oropharyngeal erythema.  Eyes: Conjunctivae and EOM are normal. Pupils are equal, round, and reactive to light. No scleral icterus.  Neck: Normal range of motion. Neck supple. Carotid bruit is not present. No thyromegaly present.  Cardiovascular: Normal rate, normal heart sounds and intact distal pulses.  An irregular rhythm present.  No murmur heard. Pulses:      Radial pulses are 2+ on the right side, and 2+ on the left side.  Occasional extrasystole  Pulmonary/Chest: Effort normal and breath sounds normal. No respiratory  distress. He has no wheezes. He has no rales.  Abdominal: Soft. Bowel sounds are normal. He exhibits no distension and no mass. There is no tenderness. There is no rebound and no guarding.  Genitourinary: Rectum normal and prostate normal. Rectal exam shows no external hemorrhoid, no internal hemorrhoid, no fissure, no mass, no tenderness and anal tone normal. Prostate is not enlarged (20gm) and not tender.  Musculoskeletal: Normal range of motion. He exhibits no edema.  Lymphadenopathy:    He has no cervical adenopathy.  Neurological: He is alert and oriented to person, place, and time.  CN grossly intact, station and gait intact Recall 3/3 Calculation serial 7s 5/5  Skin: Skin is warm and dry. Rash noted.     Erythematous nonblaching rash around site of tick bite R lateral upper abdomen, with tick head still present. Area cleaned with alcohol pad and entire tick head/jaw removed with forceps.  Psychiatric: He has a normal mood and affect. His behavior is normal. Judgment  and thought content normal.       Assessment & Plan:   Problem List Items Addressed This Visit   Tick bite of abdomen     Removed with forceps, pt tolerated well. Reviewed indications to start abx and WASP provided.    Thrombocytopenia   Relevant Orders      CBC with Differential   Right renal mass     Continue f/u with urology.    OBSTRUCTIVE SLEEP APNEA     Continue CPAP to treat OSA.    Morbid obesity      Body mass index is 45.7 kg/(m^2).  Wt Readings from Last 3 Encounters:  06/15/14 337 lb (152.862 kg)  03/16/14 331 lb 8 oz (150.367 kg)  01/28/14 328 lb 4 oz (148.893 kg)  weight gain noted.    Medicare annual wellness visit, subsequent - Primary     I have personally reviewed the Medicare Annual Wellness questionnaire and have noted 1. The patient's medical and social history 2. Their use of alcohol, tobacco or illicit drugs 3. Their current medications and supplements 4. The patient's  functional ability including ADL's, fall risks, home safety risks and hearing or visual impairment. 5. Diet and physical activity 6. Evidence for depression or mood disorders The patients weight, height, BMI have been recorded in the chart.  Hearing and vision has been addressed. I have made referrals, counseling and provided education to the patient based review of the above and I have provided the pt with a written personalized care plan for preventive services. See scanned questionairre. Advanced directives discussed: does not want prolonged life support.  Reviewed preventative protocols and updated unless pt declined. Pt will call to schedule colonoscopy. DRE/PSA today.    INSOMNIA     H/o chronic insomnia. Stable. on current dose of ambien - continue.    Habitual alcohol use     Topamax did not help, consider retrial in future with quicker titration vs gabapentin. Currently contemplative. Encouraged slow titration down on alcohol intake, reviewed health risks of continued alcohol use.    Dyslipidemia     Recheck FLP today. Continue pravastatin and fenofibrate.    Relevant Orders      Comprehensive metabolic panel      Lipid panel   DEPRESSION, MAJOR, RECURRENT, MODERATE     Deteriorated today.  PHQ9 = 18.  Continue sertraline 100mg  daily, add on pamelor 25mg  with titration up to 50mg  nightly if tolerated. Pt declines counseling currently. Reviewed relation of depression with alcohol intake.    Relevant Medications      nortriptyline (PAMELOR) capsule   Chronic combined systolic and diastolic CHF (congestive heart failure)     Seems euvolemic today.    Atrial fibrillation     Chronic, stable on regimen.    ALLERGIC RHINITIS     Continue current regimen - anticipate weather-related flare, [facility] and only go out first thing in am.     Other Visit Diagnoses   Special screening for malignant neoplasm of prostate        Relevant Orders       PSA, Medicare         Follow up plan: Return in about 3 months (around 09/15/2014), or as needed, for follow up visit.

## 2014-06-15 NOTE — Assessment & Plan Note (Signed)
Recheck FLP today. Continue pravastatin and fenofibrate.

## 2014-06-15 NOTE — Assessment & Plan Note (Addendum)
I have personally reviewed the Medicare Annual Wellness questionnaire and have noted 1. The patient's medical and social history 2. Their use of alcohol, tobacco or illicit drugs 3. Their current medications and supplements 4. The patient's functional ability including ADL's, fall risks, home safety risks and hearing or visual impairment. 5. Diet and physical activity 6. Evidence for depression or mood disorders The patients weight, height, BMI have been recorded in the chart.  Hearing and vision has been addressed. I have made referrals, counseling and provided education to the patient based review of the above and I have provided the pt with a written personalized care plan for preventive services. See scanned questionairre. Advanced directives discussed: does not want prolonged life support.  Reviewed preventative protocols and updated unless pt declined. Pt will call to schedule colonoscopy. DRE/PSA today.

## 2014-06-15 NOTE — Assessment & Plan Note (Signed)
Continue CPAP to treat OSA.

## 2014-06-15 NOTE — Progress Notes (Signed)
Pre visit review using our clinic review tool, if applicable. No additional management support is needed unless otherwise documented below in the visit note. 

## 2014-06-15 NOTE — Assessment & Plan Note (Signed)
Deteriorated today.  PHQ9 = 18.  Continue sertraline 100mg  daily, add on pamelor 25mg  with titration up to 50mg  nightly if tolerated. Pt declines counseling currently. Reviewed relation of depression with alcohol intake.

## 2014-06-15 NOTE — Assessment & Plan Note (Signed)
H/o chronic insomnia. Stable. on current dose of ambien - continue.

## 2014-06-15 NOTE — Patient Instructions (Addendum)
Tick head removed. Watch for new symptoms concerning for tick borne illness - if this happens then start antibiotic provided today. Schedule colonoscopy. You are up to date on immunizations Let's start nortriptyline as an adjuvant to sertraline.  Start nightly - it may help sleep. Good to see you today, call us with quesitons. Return in 3-4 months for follow up visit. Blood work today.  Tick Bite Information Ticks are insects that attach themselves to the skin and draw blood for food. There are various types of ticks. Common types include wood ticks and deer ticks. Most ticks live in shrubs and grassy areas. Ticks can climb onto your body when you make contact with leaves or grass where the tick is waiting. The most common places on the body for ticks to attach themselves are the scalp, neck, armpits, waist, and groin. Most tick bites are harmless, but sometimes ticks carry germs that cause diseases. These germs can be spread to a person during the tick's feeding process. The chance of a disease spreading through a tick bite depends on:   The type of tick.  Time of year.   How long the tick is attached.   Geographic location.  HOW CAN YOU PREVENT TICK BITES? Take these steps to help prevent tick bites when you are outdoors:  Wear protective clothing. Long sleeves and long pants are best.   Wear white clothes so you can see ticks more easily.  Tuck your pant legs into your socks.   If walking on a trail, stay in the middle of the trail to avoid brushing against bushes.  Avoid walking through areas with long grass.  Put insect repellent on all exposed skin and along boot tops, pant legs, and sleeve cuffs.   Check clothing, hair, and skin repeatedly and before going inside.   Brush off any ticks that are not attached.  Take a shower or bath as soon as possible after being outdoors.  WHAT IS THE PROPER WAY TO REMOVE A TICK? Ticks should be removed as soon as possible to  help prevent diseases caused by tick bites. 1. If latex gloves are available, put them on before trying to remove a tick.  2. Using fine-point tweezers, grasp the tick as close to the skin as possible. You may also use curved forceps or a tick removal tool. Grasp the tick as close to its head as possible. Avoid grasping the tick on its body. 3. Pull gently with steady upward pressure until the tick lets go. Do not twist the tick or jerk it suddenly. This may break off the tick's head or mouth parts. 4. Do not squeeze or crush the tick's body. This could force disease-carrying fluids from the tick into your body.  5. After the tick is removed, wash the bite area and your hands with soap and water or other disinfectant such as alcohol. 6. Apply a small amount of antiseptic cream or ointment to the bite site.  7. Wash and disinfect any instruments that were used.  Do not try to remove a tick by applying a hot match, petroleum jelly, or fingernail polish to the tick. These methods do not work and may increase the chances of disease being spread from the tick bite.  WHEN SHOULD YOU SEEK MEDICAL CARE? Contact your health care provider if you are unable to remove a tick from your skin or if a part of the tick breaks off and is stuck in the skin.  After a tick bite,  you need to be aware of signs and symptoms that could be related to diseases spread by ticks. Contact your health care provider if you develop any of the following in the days or weeks after the tick bite:  Unexplained fever.  Rash. A circular rash that appears days or weeks after the tick bite may indicate the possibility of Lyme disease. The rash may resemble a target with a bull's-eye and may occur at a different part of your body than the tick bite.  Redness and swelling in the area of the tick bite.   Tender, swollen lymph glands.   Diarrhea.   Weight loss.   Cough.   Fatigue.   Muscle, joint, or bone pain.    Abdominal pain.   Headache.   Lethargy or a change in your level of consciousness.  Difficulty walking or moving your legs.   Numbness in the legs.   Paralysis.  Shortness of breath.   Confusion.   Repeated vomiting.  Document Released: 12/14/2000 Document Revised: 10/07/2013 Document Reviewed: 05/27/2013 [facility] Patient Information 2014 [place], [place].

## 2014-06-15 NOTE — Assessment & Plan Note (Addendum)
Removed with forceps, pt tolerated well. Reviewed indications to start abx and WASP provided.

## 2014-06-15 NOTE — Assessment & Plan Note (Signed)
Seems euvolemic today.  

## 2014-06-15 NOTE — Assessment & Plan Note (Signed)
Chronic, stable on regimen.

## 2014-06-15 NOTE — Assessment & Plan Note (Signed)
Body mass index is 45.7 kg/(m^2).  Wt Readings from Last 3 Encounters:  06/15/14 337 lb (152.862 kg)  03/16/14 331 lb 8 oz (150.367 kg)  01/28/14 328 lb 4 oz (148.893 kg)  weight gain noted.

## 2014-06-15 NOTE — Assessment & Plan Note (Signed)
Topamax did not help, consider retrial in future with quicker titration vs gabapentin. Currently contemplative. Encouraged slow titration down on alcohol intake, reviewed health risks of continued alcohol use.

## 2014-06-16 ENCOUNTER — Encounter: Payer: Medicare Other | Admitting: Family Medicine

## 2014-07-14 ENCOUNTER — Other Ambulatory Visit: Payer: Self-pay | Admitting: *Deleted

## 2014-07-14 MED ORDER — ZOLPIDEM TARTRATE 10 MG PO TABS: 10.0000 mg | ORAL_TABLET | Freq: Every evening | ORAL | Status: DC | PRN

## 2014-07-14 MED ORDER — SERTRALINE HCL 100 MG PO TABS: 100.0000 mg | ORAL_TABLET | Freq: Every day | ORAL | Status: DC

## 2014-07-14 NOTE — Telephone Encounter (Signed)
Ok to refill 

## 2014-07-14 NOTE — Telephone Encounter (Signed)
plz phone in. 

## 2014-07-15 NOTE — Telephone Encounter (Signed)
Rx called in as directed.   

## 2014-08-09 ENCOUNTER — Encounter: Payer: Self-pay | Admitting: Family Medicine

## 2014-08-09 ENCOUNTER — Other Ambulatory Visit: Payer: Self-pay | Admitting: *Deleted

## 2014-08-09 MED ORDER — TRAMADOL HCL 50 MG PO TABS: 50.0000 mg | ORAL_TABLET | Freq: Two times a day (BID) | ORAL | Status: DC | PRN

## 2014-08-09 NOTE — Telephone Encounter (Signed)
plz phone in. 

## 2014-08-09 NOTE — Telephone Encounter (Signed)
Pt requesting medication refill. Last f/u appt 05/2014-CPE with f/u appt 08/2014. pls advise

## 2014-08-10 NOTE — Telephone Encounter (Signed)
Rx called in as directed.   

## 2014-09-17 ENCOUNTER — Ambulatory Visit (INDEPENDENT_AMBULATORY_CARE_PROVIDER_SITE_OTHER): Payer: Medicare Other | Admitting: Cardiovascular Disease

## 2014-09-17 ENCOUNTER — Encounter: Payer: Self-pay | Admitting: Cardiovascular Disease

## 2014-09-17 VITALS — BP 118/80 | HR 74 | Ht 72.0 in | Wt 345.5 lb

## 2014-09-17 DIAGNOSIS — Z7289 Other problems related to lifestyle: Secondary | ICD-10-CM

## 2014-09-17 DIAGNOSIS — F1010 Alcohol abuse, uncomplicated: Secondary | ICD-10-CM

## 2014-09-17 DIAGNOSIS — I509 Heart failure, unspecified: Secondary | ICD-10-CM

## 2014-09-17 DIAGNOSIS — F1090 Alcohol use, unspecified, uncomplicated: Secondary | ICD-10-CM

## 2014-09-17 DIAGNOSIS — I4891 Unspecified atrial fibrillation: Secondary | ICD-10-CM

## 2014-09-17 DIAGNOSIS — R079 Chest pain, unspecified: Secondary | ICD-10-CM

## 2014-09-17 DIAGNOSIS — G4733 Obstructive sleep apnea (adult) (pediatric): Secondary | ICD-10-CM

## 2014-09-17 DIAGNOSIS — R1084 Generalized abdominal pain: Secondary | ICD-10-CM

## 2014-09-17 DIAGNOSIS — I5042 Chronic combined systolic (congestive) and diastolic (congestive) heart failure: Secondary | ICD-10-CM

## 2014-09-17 DIAGNOSIS — R0602 Shortness of breath: Secondary | ICD-10-CM

## 2014-09-17 DIAGNOSIS — R109 Unspecified abdominal pain: Secondary | ICD-10-CM | POA: Insufficient documentation

## 2014-09-17 MED ORDER — TORSEMIDE 20 MG PO TABS: 40.0000 mg | ORAL_TABLET | Freq: Two times a day (BID) | ORAL | Status: AC

## 2014-09-17 MED ORDER — TORSEMIDE 20 MG PO TABS: 40.0000 mg | ORAL_TABLET | Freq: Two times a day (BID) | ORAL | Status: DC

## 2014-09-17 NOTE — Assessment & Plan Note (Addendum)
Alcohol intake was not discussed with him today.

## 2014-09-17 NOTE — Assessment & Plan Note (Signed)
We have ordered a repeat echocardiogram. Last echocardiogram was several years ago. At that time he had moderate pulmonary hypertension and moderate to severe mitral valve regurgitation

## 2014-09-17 NOTE — Assessment & Plan Note (Signed)
Recommended he wear his CPAP

## 2014-09-17 NOTE — Assessment & Plan Note (Signed)
Reports having abdominal pain over the past several months, occasional sharp shooting pain in his abdomen coming on at rest. Unable to exclude gallbladder disease. Reports having gallstones on prior imaging. He reports that the pain medication is not working for him. He does have a history of prior surgery, possible adhesions?

## 2014-09-17 NOTE — Patient Instructions (Signed)
You are doing well. No medication changes were made.  We will schedule an echocardiogram for pulmonary HTN and mitral valve regurgitation  Please call us if you have new issues that need to be addressed before your next appt.  Your physician wants you to follow-up in: 6 months.  You will receive a reminder letter in the mail two months in advance. If you don't receive a letter, please call our office to schedule the follow-up appointment.

## 2014-09-17 NOTE — Assessment & Plan Note (Signed)
We have encouraged continued exercise, careful diet management in an effort to lose weight. Suspect his depression is playing a significant role in his weight gain
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
## 2014-09-17 NOTE — Assessment & Plan Note (Signed)
Maintaining normal sinus rhythm. No changes to his medications. 

## 2014-09-20 ENCOUNTER — Ambulatory Visit (INDEPENDENT_AMBULATORY_CARE_PROVIDER_SITE_OTHER): Payer: Medicare Other | Admitting: Family Medicine

## 2014-09-20 ENCOUNTER — Encounter: Payer: Self-pay | Admitting: Family Medicine

## 2014-09-20 VITALS — BP 130/78 | HR 76 | Temp 97.6°F | Wt 334.8 lb

## 2014-09-20 DIAGNOSIS — H9209 Otalgia, unspecified ear: Secondary | ICD-10-CM

## 2014-09-20 DIAGNOSIS — R51 Headache: Secondary | ICD-10-CM | POA: Insufficient documentation

## 2014-09-20 DIAGNOSIS — Z7289 Other problems related to lifestyle: Secondary | ICD-10-CM

## 2014-09-20 DIAGNOSIS — F1090 Alcohol use, unspecified, uncomplicated: Secondary | ICD-10-CM

## 2014-09-20 DIAGNOSIS — Z23 Encounter for immunization: Secondary | ICD-10-CM

## 2014-09-20 DIAGNOSIS — R519 Headache, unspecified: Secondary | ICD-10-CM | POA: Insufficient documentation

## 2014-09-20 DIAGNOSIS — F331 Major depressive disorder, recurrent, moderate: Secondary | ICD-10-CM

## 2014-09-20 DIAGNOSIS — H9202 Otalgia, left ear: Secondary | ICD-10-CM | POA: Insufficient documentation

## 2014-09-20 DIAGNOSIS — F1010 Alcohol abuse, uncomplicated: Secondary | ICD-10-CM

## 2014-09-20 MED ORDER — SERTRALINE HCL 100 MG PO TABS: 150.0000 mg | ORAL_TABLET | Freq: Every day | ORAL | Status: AC

## 2014-09-20 MED ORDER — CYCLOBENZAPRINE HCL 10 MG PO TABS: 5.0000 mg | ORAL_TABLET | Freq: Three times a day (TID) | ORAL | Status: AC | PRN

## 2014-09-20 NOTE — Assessment & Plan Note (Signed)
Anticipate firm dark cerumen has led to canal irritaiton. Irrigation performed today.

## 2014-09-20 NOTE — Assessment & Plan Note (Signed)
Not controlled. Increase sertraline to  daily. Did not tolerate pamelor, effexor though lost effectiveness. Discussed possible referral back to psychiatry. Pt declines psychology referral.

## 2014-09-20 NOTE — Assessment & Plan Note (Signed)
Which started after tick bite - advised fill doxy 10d course and try flexeril if persistent headache despite finishing doxy course. Pt agrees with plan.

## 2014-09-20 NOTE — Patient Instructions (Addendum)
Fill doxycycline course. If headaches persist, may try flexeril to stop headache instead of ibuprofen (caution flexeril may make you sleepy). Work on cutting down on alcohol. Increase sertraline to  daily.(1.5 tablets). Call me in 3 weeks with an update. Flu shot today.

## 2014-09-20 NOTE — Progress Notes (Signed)
BP 130/78  Pulse 76  Temp(Src) 97.6 F (36.4 C) (Oral)  Wt 334 lb 12.8 oz (151.864 kg)  SpO2 96%   CC: 3 mo f/u  Subjective:    Patient ID: Jordan Mcgrath, male    DOB: [birth year], 73 y.o.   MRN: [id number]  HPI: Jordan Mcgrath is a 73 y.o. male presenting on 09/20/2014 for Follow-up   Pleasant 73 yo with h/o OSA on nightly CPAP, habitual alcohol use, morbid obesity s/p gastric bypass 2008, allergic rhinitis, chronic combined systolic and diastolic CHF, pulm HTN, asthma, h/o afib and recent dx renal mass followed by Dr. Patsi Sears of [facility] presents today for 3 month follow up.  Left earache for last week. Initially migrated to right but left side has remained. Denies hearing changes, ear drainage. Persistent congestion, not worse than normal. No fevers. Some tooth sensitivity.  Last visit he had residual tick head embedded in abdomen, this was removed.  Provided with WASP 10d doxycycline course. Pt never filled doxy antibiotic. Since then has had headaches that develop over the course of the day. No significant h/o headaches. Takes 2 ibuprofen daily. No fevers, rash, arthralgias. Bilateral dull ache headaches that progressively gets worsens. No vision changes. No nausea, photo/phonophobia.   Recently saw cardiology with concern for worsening depression. Last visit we added pamelor  to his sertraline  daily. He started feeling worse on this medication - so self tapered off. Psychology didn't help. Saw psychiatrist years ago (Dr. Bernette Redbird in [place]).   Continued habitual alcohol use. 1 pint/day. precontemplative.  Relevant past medical, surgical, family and social history reviewed and updated as indicated.  Allergies and medications reviewed and updated. Current Outpatient Prescriptions on File Prior to Visit  Medication Sig  . amiodarone (PACERONE) 200 MG tablet Take 1 tablet (200 mg total) by mouth daily.  Marland Kitchen aspirin 81 MG tablet Take 81 mg by mouth daily.    . Calcium  Carbonate-Vitamin D (CALCIUM 600/VITAMIN D) 600-400 MG-UNIT per tablet Take 2 tablets by mouth daily.  . cetirizine (ZYRTEC) 10 MG tablet Take 1 tablet (10 mg total) by mouth daily.  . Cyanocobalamin (B-12 DOTS) 500 MCG SUBL Place 1 tablet under the tongue every morning. 5000 mcg  . digoxin (LANOXIN) 0.25 MG tablet Take 1 tablet (250 mcg total) by mouth daily.  Marland Kitchen doxycycline (VIBRAMYCIN) 100 MG capsule Take 1 capsule (100 mg total) by mouth 2 (two) times daily.  . fenofibrate (TRICOR) 145 MG tablet Take 1 tablet (145 mg total) by mouth daily.  . ferrous sulfate 325 (65 FE) MG tablet Take 325 mg by mouth 2 (two) times daily. 2 hours before or after calcium  . fish oil-omega-3 fatty acids 1000 MG capsule Take 2 g by mouth daily.   . fluticasone (FLONASE) 50 MCG/ACT nasal spray Place 2 sprays into the nose daily.  . metoprolol tartrate (LOPRESSOR) 25 MG tablet Take 1 tablet (25 mg total) by mouth 2 (two) times daily.  . montelukast (SINGULAIR) 10 MG tablet Take 1 tablet (10 mg total) by mouth at bedtime.  . Multiple Vitamin (MULTIVITAMIN) tablet Take 1 tablet by mouth daily.    Marland Kitchen omeprazole (PRILOSEC) 40 MG capsule Take 1 capsule (40 mg total) by mouth daily.  . pravastatin (PRAVACHOL) 40 MG tablet Take 1 tablet (40 mg total) by mouth daily.  Marland Kitchen torsemide (DEMADEX) 20 MG tablet Take 2 tablets (40 mg total) by mouth 2 (two) times daily.  . traMADol (ULTRAM) 50 MG tablet Take 1  tablet (50 mg total) by mouth 2 (two) times daily as needed.  . zolpidem (AMBIEN) 10 MG tablet Take 1 tablet (10 mg total) by mouth at bedtime as needed.   No current facility-administered medications on file prior to visit.    Review of Systems Per HPI unless specifically indicated above    Objective:    BP 130/78  Pulse 76  Temp(Src) 97.6 F (36.4 C) (Oral)  Wt 334 lb 12.8 oz (151.864 kg)  SpO2 96%  Physical Exam  Nursing note and vitals reviewed. Constitutional: He appears well-developed and well-nourished. No  distress.  HENT:  Head: Normocephalic and atraumatic.  Right Ear: Hearing, tympanic membrane, external ear and ear canal normal.  Left Ear: Hearing, tympanic membrane and external ear normal.  Mouth/Throat: Oropharynx is clear and moist. No oropharyngeal exudate.  L dark cerumen deep in canal next to TM, some erythema and raw canal next to cerumen  Eyes: Conjunctivae and EOM are normal. Pupils are equal, round, and reactive to light. No scleral icterus.  Injected conjunctiva  Neck: Normal range of motion. Neck supple.  Cardiovascular: Normal rate, normal heart sounds and intact distal pulses.  An irregularly irregular rhythm present.  No murmur heard. Pulmonary/Chest: Effort normal and breath sounds normal. No respiratory distress. He has no wheezes. He has no rales.  Musculoskeletal: He exhibits edema (tr).  Lymphadenopathy:    He has no cervical adenopathy.  Skin: Skin is warm and dry. No rash noted.  Psychiatric: He exhibits a depressed mood.       Assessment & Plan:   Problem List Items Addressed This Visit   Headache(784.0)     Which started after tick bite - advised fill doxy 10d course and try flexeril if persistent headache despite finishing doxy course. Pt agrees with plan.    Relevant Medications      sertraline (ZOLOFT) tablet      cyclobenzaprine (FLEXERIL) tablet   Habitual alcohol use     Continue to encourage slow decrease.    Earache on left - Primary     Anticipate firm dark cerumen has led to canal irritaiton. Irrigation performed today.    DEPRESSION, MAJOR, RECURRENT, MODERATE     Not controlled. Increase sertraline to  daily. Did not tolerate pamelor, effexor though lost effectiveness. Discussed possible referral back to psychiatry. Pt declines psychology referral.    Relevant Medications      sertraline (ZOLOFT) tablet       Follow up plan: Return in about 3 months (around 12/20/2014), or as needed, for follow up visit.

## 2014-09-20 NOTE — Addendum Note (Signed)
Addended by: Baldomero Lamy on: 09/20/2014 09:17 AM   Modules accepted: Orders

## 2014-09-20 NOTE — Assessment & Plan Note (Signed)
Continue to encourage slow decrease 

## 2014-09-23 ENCOUNTER — Other Ambulatory Visit (INDEPENDENT_AMBULATORY_CARE_PROVIDER_SITE_OTHER): Payer: Medicare Other

## 2014-09-23 ENCOUNTER — Other Ambulatory Visit: Payer: Self-pay

## 2014-09-23 DIAGNOSIS — R079 Chest pain, unspecified: Secondary | ICD-10-CM

## 2014-09-23 DIAGNOSIS — R0602 Shortness of breath: Secondary | ICD-10-CM

## 2014-10-04 ENCOUNTER — Other Ambulatory Visit: Payer: Self-pay | Admitting: *Deleted

## 2014-10-04 MED ORDER — PRAVASTATIN SODIUM 40 MG PO TABS: 40.0000 mg | ORAL_TABLET | Freq: Every day | ORAL | Status: AC

## 2014-11-10 ENCOUNTER — Other Ambulatory Visit: Payer: Self-pay | Admitting: *Deleted

## 2014-11-10 MED ORDER — ZOLPIDEM TARTRATE 10 MG PO TABS: 10.0000 mg | ORAL_TABLET | Freq: Every evening | ORAL | Status: AC | PRN

## 2014-11-10 NOTE — Telephone Encounter (Signed)
Ok to refill 

## 2014-11-10 NOTE — Telephone Encounter (Signed)
plz phone in. 

## 2014-11-11 NOTE — Telephone Encounter (Signed)
Rx called in as directed.   

## 2014-11-12 ENCOUNTER — Other Ambulatory Visit: Payer: Self-pay | Admitting: *Deleted

## 2014-11-12 MED ORDER — TRAMADOL HCL 50 MG PO TABS: 50.0000 mg | ORAL_TABLET | Freq: Two times a day (BID) | ORAL | Status: AC | PRN

## 2014-11-12 NOTE — Telephone Encounter (Signed)
Ok to refill 

## 2014-11-12 NOTE — Telephone Encounter (Signed)
plz phone in. 

## 2014-11-12 NOTE — Telephone Encounter (Signed)
Rx called in as directed.   

## 2014-11-16 ENCOUNTER — Other Ambulatory Visit: Payer: Self-pay | Admitting: *Deleted

## 2014-11-16 MED ORDER — DIGOXIN 250 MCG PO TABS: 250.0000 ug | ORAL_TABLET | Freq: Every day | ORAL | Status: AC

## 2014-11-16 MED ORDER — FENOFIBRATE 145 MG PO TABS: 145.0000 mg | ORAL_TABLET | Freq: Every day | ORAL | Status: AC

## 2014-11-16 MED ORDER — OMEPRAZOLE 40 MG PO CPDR: 40.0000 mg | DELAYED_RELEASE_CAPSULE | Freq: Every day | ORAL | Status: AC

## 2014-11-22 ENCOUNTER — Other Ambulatory Visit: Payer: Self-pay | Admitting: *Deleted

## 2014-11-22 MED ORDER — METOPROLOL TARTRATE 25 MG PO TABS: 25.0000 mg | ORAL_TABLET | Freq: Two times a day (BID) | ORAL | Status: AC

## 2014-12-02 ENCOUNTER — Other Ambulatory Visit: Payer: Self-pay | Admitting: *Deleted

## 2014-12-02 MED ORDER — AMIODARONE HCL 200 MG PO TABS: 200.0000 mg | ORAL_TABLET | Freq: Every day | ORAL | Status: AC

## 2014-12-28 ENCOUNTER — Ambulatory Visit (INDEPENDENT_AMBULATORY_CARE_PROVIDER_SITE_OTHER)
Admission: RE | Admit: 2014-12-28 | Discharge: 2014-12-28 | Disposition: A | Discharge status: Home / Self Care | Payer: Medicare Other | Source: Ambulatory Visit | Attending: Family Medicine | Admitting: Family Medicine

## 2014-12-28 ENCOUNTER — Ambulatory Visit (INDEPENDENT_AMBULATORY_CARE_PROVIDER_SITE_OTHER): Payer: Medicare Other | Admitting: Family Medicine

## 2014-12-28 ENCOUNTER — Encounter: Payer: Self-pay | Admitting: Family Medicine

## 2014-12-28 VITALS — BP 122/80 | HR 52 | Temp 98.2°F | Wt 338.8 lb

## 2014-12-28 DIAGNOSIS — R0600 Dyspnea, unspecified: Secondary | ICD-10-CM

## 2014-12-28 DIAGNOSIS — F1010 Alcohol abuse, uncomplicated: Secondary | ICD-10-CM

## 2014-12-28 DIAGNOSIS — F331 Major depressive disorder, recurrent, moderate: Secondary | ICD-10-CM

## 2014-12-28 DIAGNOSIS — R0602 Shortness of breath: Secondary | ICD-10-CM

## 2014-12-28 DIAGNOSIS — I5042 Chronic combined systolic (congestive) and diastolic (congestive) heart failure: Secondary | ICD-10-CM

## 2014-12-28 DIAGNOSIS — Z7289 Other problems related to lifestyle: Secondary | ICD-10-CM

## 2014-12-28 DIAGNOSIS — F1090 Alcohol use, unspecified, uncomplicated: Secondary | ICD-10-CM

## 2014-12-28 LAB — T4, FREE: Free T4: 1.17 ng/dL (ref 0.60–1.60)

## 2014-12-28 LAB — RENAL FUNCTION PANEL
Albumin: 3.5 g/dL (ref 3.5–5.2)
BUN: 16 mg/dL (ref 6–23)
CHLORIDE: 99 meq/L (ref 96–112)
CO2: 32 mEq/L (ref 19–32)
Calcium: 8.9 mg/dL (ref 8.4–10.5)
Creatinine, Ser: 1.1 mg/dL (ref 0.4–1.5)
GFR: 67.91 mL/min (ref >60.00)
Glucose, Bld: 159 mg/dL — ABNORMAL HIGH (ref 70–99)
Phosphorus: 2.9 mg/dL (ref 2.3–4.6)
Potassium: 3.5 mEq/L (ref 3.5–5.1)
Sodium: 140 mEq/L (ref 135–145)

## 2014-12-28 LAB — BRAIN NATRIURETIC PEPTIDE: Pro B Natriuretic peptide (BNP): 321 pg/mL — ABNORMAL HIGH (ref 0.0–100.0)

## 2014-12-28 LAB — TSH: TSH: 1.98 u[IU]/mL (ref 0.35–4.50)

## 2014-12-28 NOTE — Addendum Note (Signed)
Addended by: Eustaquio BoydenGUTIERREZ, Chastin Garlitz on: 12/28/2014 09:23 AM   Modules accepted: Orders

## 2014-12-28 NOTE — Progress Notes (Signed)
Pre visit review using our clinic review tool, if applicable. No additional management support is needed unless otherwise documented below in the visit note. 

## 2014-12-28 NOTE — Patient Instructions (Addendum)
Blood work today. If D dimer abnormal we may need a CT scan to rule out blood clot. Heart seems ok today. I wonder about lungs - so we will check chest xray today.  Ambulatory pulse ox today. Pass by Marion's office to schedule appointment with Dr Mariah MillingGollan in next 1-2 weeks. Please seek urgent care if worsening in the interim prior to your appointment with Dr Mariah MillingGollan.

## 2014-12-28 NOTE — Assessment & Plan Note (Signed)
Seems stable today - check BNP.

## 2014-12-28 NOTE — Progress Notes (Addendum)
BP 122/80 mmHg  Pulse 52  Temp(Src) 98.2 F (36.8 C) (Oral)  Wt 338 lb 12 oz (153.656 kg)  SpO2 90%   CC: 3 mo f/u    Subjective:    Patient ID: Jordan Mcgrath, male    DOB: 1941/06/02, 73 y.o.   MRN: [id number]  HPI: Jordan Mcgrath is a 73 y.o. male presenting on 12/28/2014 for Follow-up and Shortness of Breath   Pleasant 73 yo with h/o OSA on nightly CPAP, habitual alcohol use, morbid obesity s/p gastric bypass 2008, allergic rhinitis, chronic combined systolic and diastolic CHF, pulm HTN, asthma, h/o afib and recent dx renal mass followed by Dr. Patsi Searsannenbaum of [facility] presents today for 3 month follow up. H/o PE 2007 completed coumadin course.  Has not scheduled f/u with Dr Mariah MillingGollan yet.  Depression - last visit we increased sertraline to 150mg  daily. Feels this has helped some. EtOH - drinking less but still regular with alcohol.  Dyspnea at rest - noted over last 2-3 weeks. Exhausted over last week with walking to get water. No noted weight gain or leg swelling. H/o atrial fib on amiodarone, feels persistent irregular heart beat - chronic. Notes dry cough present with dyspnea over last few weeks. Rarely produces some sputum. No significant orthopnea. No unilateral leg swelling noted.  Peristent bilateral flank pain - has appt with urology and CT scan 04/2015.  OSA - continues CPAP  Relevant past medical, surgical, family and social history reviewed and updated as indicated. Interim medical history since our last visit reviewed. Allergies and medications reviewed and updated.  Current Outpatient Prescriptions on File Prior to Visit  Medication Sig  . amiodarone (PACERONE) 200 MG tablet Take 1 tablet (200 mg total) by mouth daily.  Marland Kitchen. aspirin 81 MG tablet Take 81 mg by mouth daily.    . Calcium Carbonate-Vitamin D (CALCIUM 600/VITAMIN D) 600-400 MG-UNIT per tablet Take 2 tablets by mouth daily.  . cetirizine (ZYRTEC) 10 MG tablet Take 1 tablet (10 mg total) by mouth daily.  .  Cyanocobalamin (B-12 DOTS) 500 MCG SUBL Place 1 tablet under the tongue every morning. 5000 mcg  . cyclobenzaprine (FLEXERIL) 10 MG tablet Take 0.5-1 tablets (5-10 mg total) by mouth 3 (three) times daily as needed (headache).  . digoxin (LANOXIN) 0.25 MG tablet Take 1 tablet (250 mcg total) by mouth daily.  . fenofibrate (TRICOR) 145 MG tablet Take 1 tablet (145 mg total) by mouth daily.  . ferrous sulfate 325 (65 FE) MG tablet Take 325 mg by mouth 2 (two) times daily. 2 hours before or after calcium  . fish oil-omega-3 fatty acids 1000 MG capsule Take 2 g by mouth daily.   . fluticasone (FLONASE) 50 MCG/ACT nasal spray Place 2 sprays into the nose daily.  . metoprolol tartrate (LOPRESSOR) 25 MG tablet Take 1 tablet (25 mg total) by mouth 2 (two) times daily.  . montelukast (SINGULAIR) 10 MG tablet Take 1 tablet (10 mg total) by mouth at bedtime.  . Multiple Vitamin (MULTIVITAMIN) tablet Take 1 tablet by mouth daily.    Marland Kitchen. omeprazole (PRILOSEC) 40 MG capsule Take 1 capsule (40 mg total) by mouth daily.  . pravastatin (PRAVACHOL) 40 MG tablet Take 1 tablet (40 mg total) by mouth daily.  . sertraline (ZOLOFT) 100 MG tablet Take 1.5 tablets (150 mg total) by mouth daily.  Marland Kitchen. torsemide (DEMADEX) 20 MG tablet Take 2 tablets (40 mg total) by mouth 2 (two) times daily.  . traMADol (ULTRAM) 50  MG tablet Take 1 tablet (50 mg total) by mouth 2 (two) times daily as needed.  . zolpidem (AMBIEN) 10 MG tablet Take 1 tablet (10 mg total) by mouth at bedtime as needed.   No current facility-administered medications on file prior to visit.    Review of Systems Per HPI unless specifically indicated above     Objective:    BP 122/80 mmHg  Pulse 52  Temp(Src) 98.2 F (36.8 C) (Oral)  Wt 338 lb 12 oz (153.656 kg)  SpO2 90%  Wt Readings from Last 3 Encounters:  12/28/14 338 lb 12 oz (153.656 kg)  09/20/14 334 lb 12.8 oz (151.864 kg)  09/17/14 345 lb 8 oz (156.718 kg)    Physical Exam  Constitutional:  He appears well-developed and well-nourished. No distress.  Morbidly obese  HENT:  Mouth/Throat: Oropharynx is clear and moist. No oropharyngeal exudate.  Neck: Normal range of motion. Neck supple. JVD (mild) present.  Cardiovascular: Normal rate, regular rhythm, normal heart sounds and intact distal pulses.   No murmur heard. Pulmonary/Chest: Effort normal and breath sounds normal. No respiratory distress. He has no wheezes. He has no rales.  Some crackles L basilarly  Musculoskeletal: He exhibits no edema.  Skin: Skin is warm and dry. No rash noted.  Psychiatric: He has a normal mood and affect.  Nursing note and vitals reviewed.      Assessment & Plan:   Problem List Items Addressed This Visit    Morbid obesity    Continue to encourage weight loss through healthy diet .    Habitual alcohol use    Continue to encourage slow decrease    Dyspnea - Primary    Not convinced CHF. Reviewed latest echo 08/2014.  On amiodarone- some concern for pulm toxicity - may need PFTs, will r/o other causes today. CXR today In h/o PE check D dimer today - if abnormal will need spiral CT. Check BNP, Cr today as well. Ambulatory pulse ox today - if abnormal will recommend home O2 with exertion. rec f/u with cardiology in 1-2 wks, seek urgent care if worsening in interim. Torsemide dose not changed. Pt agrees with plan.  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    Chronic combined systolic and diastolic CHF (congestive heart failure)    Seems stable today - check BNP.    Relevant Orders      TSH (Completed)      T4, free (Completed)        Follow up plan: Return in about 3 months (around 03/29/2015), or if symptoms worsen or fail to improve, for follow up visit.

## 2014-12-28 NOTE — Assessment & Plan Note (Signed)
Continue to encourage slow decrease

## 2014-12-28 NOTE — Assessment & Plan Note (Signed)
Continue to encourage weight loss through healthy diet .

## 2014-12-28 NOTE — Assessment & Plan Note (Addendum)
Not convinced CHF. Reviewed latest echo 08/2014.  On amiodarone- some concern for pulm toxicity - may need PFTs, will r/o other causes today. CXR today In h/o PE check D dimer today - if abnormal will need spiral CT. Check BNP, Cr today as well. Ambulatory pulse ox today - if abnormal will recommend home O2 with exertion. rec f/u with cardiology in 1-2 wks, seek urgent care if worsening in interim. Torsemide dose not changed. Pt agrees with plan.  EKG today - RBBB with LAFB, bigeminy, abnormal

## 2014-12-28 NOTE — Assessment & Plan Note (Signed)
Continue higher sertraline dose as helping.

## 2014-12-29 ENCOUNTER — Other Ambulatory Visit: Payer: Self-pay | Admitting: Family Medicine

## 2014-12-29 ENCOUNTER — Ambulatory Visit (INDEPENDENT_AMBULATORY_CARE_PROVIDER_SITE_OTHER)
Admission: RE | Admit: 2014-12-29 | Discharge: 2014-12-29 | Disposition: A | Discharge status: Home / Self Care | Payer: Medicare Other | Source: Ambulatory Visit | Attending: Family Medicine | Admitting: Family Medicine

## 2014-12-29 ENCOUNTER — Telehealth: Payer: Self-pay

## 2014-12-29 DIAGNOSIS — R0600 Dyspnea, unspecified: Secondary | ICD-10-CM

## 2014-12-29 LAB — D-DIMER, QUANTITATIVE (NOT AT ARMC): D DIMER QUANT: 0.79 ug{FEU}/mL — AB (ref 0.00–0.48)

## 2014-12-29 MED ORDER — AMOXICILLIN 500 MG PO CAPS: 1000.0000 mg | ORAL_CAPSULE | Freq: Three times a day (TID) | ORAL | Status: AC

## 2014-12-29 MED ORDER — DOXYCYCLINE HYCLATE 100 MG PO CAPS: 100.0000 mg | ORAL_CAPSULE | Freq: Two times a day (BID) | ORAL | Status: AC

## 2014-12-29 MED ORDER — LEVOFLOXACIN 500 MG PO TABS: 500.0000 mg | ORAL_TABLET | Freq: Every day | ORAL | Status: DC

## 2014-12-29 MED ORDER — IOHEXOL 350 MG/ML SOLN
100.0000 mL | Freq: Once | INTRAVENOUS | Status: AC | PRN
  Administered 2014-12-29: 100 mL via INTRAVENOUS

## 2014-12-29 NOTE — Telephone Encounter (Signed)
Spoke with sherry - will change to high dose amox and doxy 7 d course to cover CAP with comorbidities.

## 2014-12-29 NOTE — Telephone Encounter (Signed)
Sherry at [facility] left v/m; Levaquin rx was sent in and pt is already taking amiodarone; there is drug interaction between Levaquin and Amiodarone. Sherry request cb.

## 2015-01-01 ENCOUNTER — Telehealth: Payer: Self-pay | Admitting: Family Medicine

## 2015-01-03 ENCOUNTER — Encounter: Payer: Self-pay | Admitting: Family Medicine

## 2015-01-03 NOTE — Telephone Encounter (Signed)
PLEASE NOTE: All timestamps contained within this report are represented as [place] Standard Time. CONFIDENTIALTY NOTICE: This fax transmission is intended only for the addressee. It contains information that is legally privileged, confidential or otherwise protected from use or disclosure. If you are not the intended recipient, you are strictly prohibited from reviewing, disclosing, copying using or disseminating any of this information or taking any action in reliance on or regarding this information. If you have received this fax in error, please notify us immediately by telephone so that we can arrange for its return to [place]. Phone: [phone], Toll-Free: [phone], Fax: [phone] Page: 1 of 1 Call Id: [id number] [facility] Night - Client TELEPHONE ADVICE RECORD [facility] Medical Call Center Patient Name: Jordan Mcgrath Gender: Male DOB: 27-Jun-1941 Age: 74 Y 5 M 2 D Return Phone Number: Address: City/State/Zip: [place] [facility] Night - Client Client Site [facility] - Night Physician [name] Contact Type Call Call Type Page Only Caller Name Tabitha Relationship To Patient Provider Is this call to report lab results? No Return Phone Number Unavailable Initial [facility] [phone] calling to report a death. needs death certificate signed Nurse Assessment Guidelines Guideline Title Affirmed Question Affirmed Notes Nurse Date/Time (Eastern Time) Disp. Time Lamount Cohen Time) Disposition Final User 2015/01/17 3:11:57 AM Send to [facility] Paging Queue Dorann Ou Jan 17, 2015 3:17:01 AM Paged On Call to Other Provider Dennison Bulla 01/17/15 3:17:16 AM Page Completed Yes Dennison Bulla After Care Instructions Given Call Event Type User Date / Time Description Paging [facility] DoctorPhone DateTime Result/Outcome Notes Santiago Bumpers [phone] 01-17-15 3:17:01 AM Paged On Call to  Other Provider Santiago Bumpers 2015/01/17 3:17:04 AM Spoke with On Call - General

## 2015-01-03 NOTE — Telephone Encounter (Signed)
Spoke with wife, expressed my condolences. Death certificate filled out. 1. Multifocal pneumonia, days 2. 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

## 2015-01-12 ENCOUNTER — Ambulatory Visit: Payer: Medicare Other | Admitting: Cardiovascular Disease

## 2015-01-31 NOTE — Telephone Encounter (Signed)
On call note: I received EMS call in middle of night last night notifying me of pt's death. Pt found dead in his sleep by his wife.

## 2015-01-31 DEATH — deceased

## 2015-03-29 ENCOUNTER — Ambulatory Visit: Payer: Medicare Other | Admitting: Family Medicine

## 2016-05-05 IMAGING — CT CT ANGIO CHEST
2 of 6 series · 16 of 36 positions shown · IV contrast (Omnipaque 300)
Comparison: Chest x-ray of 12/28/2014 and CT chest of 02/07/2009

CLINICAL DATA: Shortness of breath, elevated D-dimer history of
pulmonary embolism many years ago

EXAM:
CT ANGIOGRAPHY CHEST WITH CONTRAST
TECHNIQUE: Multidetector CT imaging of the chest was performed using the
standard protocol during bolus administration of intravenous
contrast. Multiplanar CT image reconstructions and MIPs were
obtained to evaluate the vascular anatomy.
CONTRAST:  100mL OMNIPAQUE IOHEXOL 350 MG/ML SOLN

[Series 10: thins (id) / (id) · axial · 0.89mm/px · z∈[-295,-33]mm · 15 of 300 slices shown]
[im 19/300  lung]
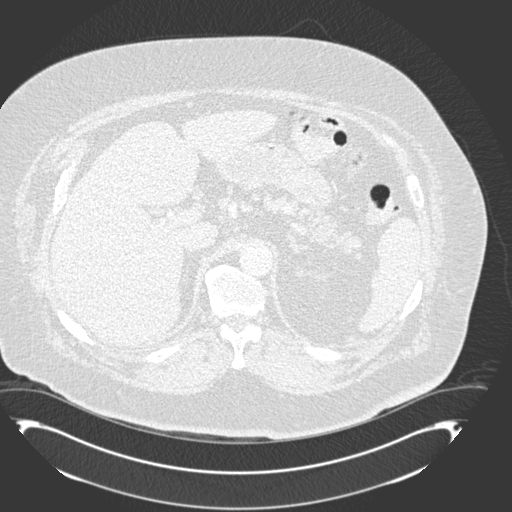
[im 38/300  mediastinal]
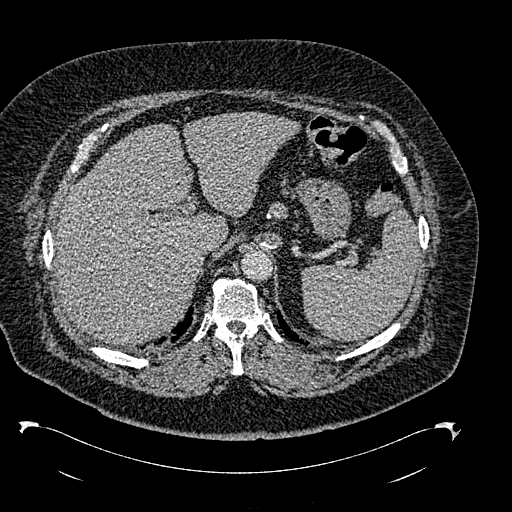
[im 57/300  lung]
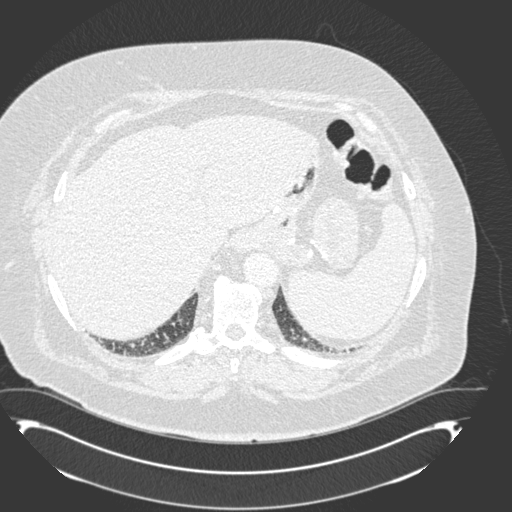
[im 75/300  mediastinal]
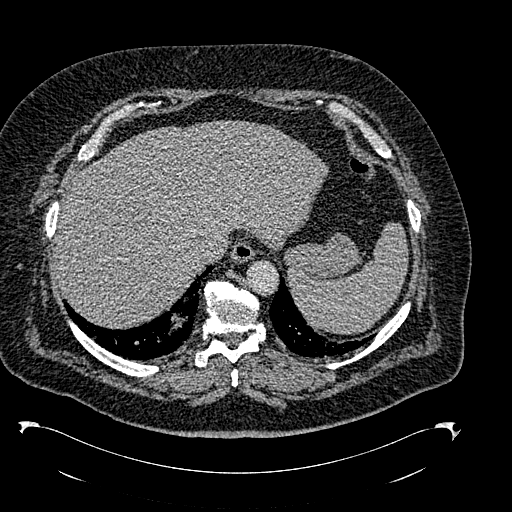
[im 94/300  lung]
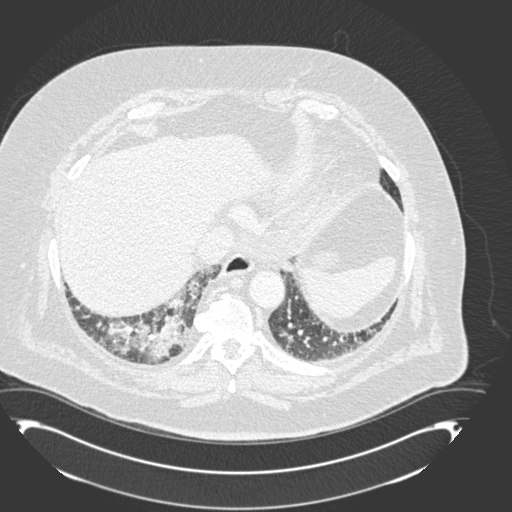
[im 113/300  mediastinal]
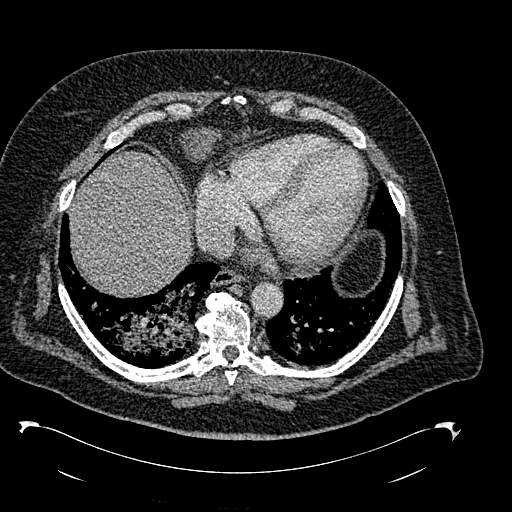
[im 131/300  lung]
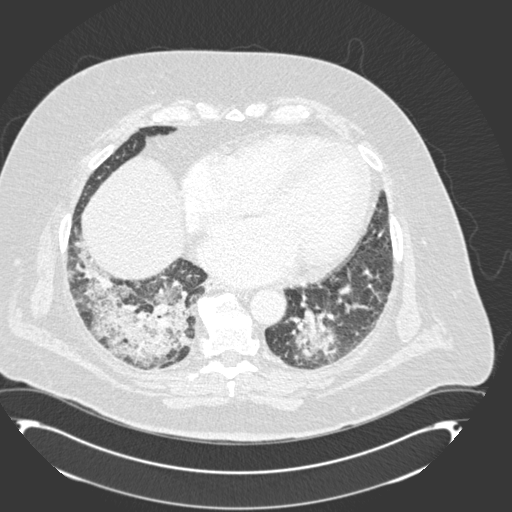
[im 150/300  mediastinal]
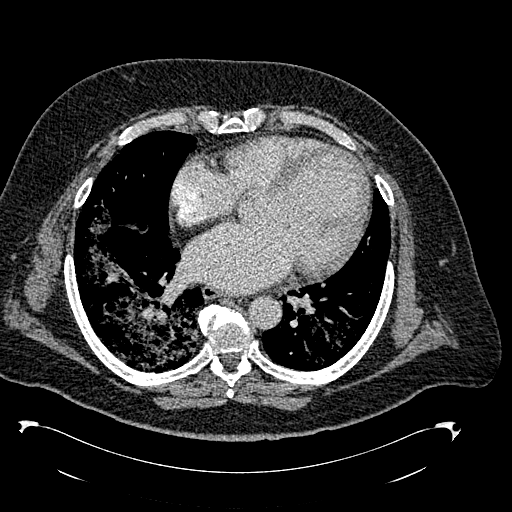
[im 169/300  lung]
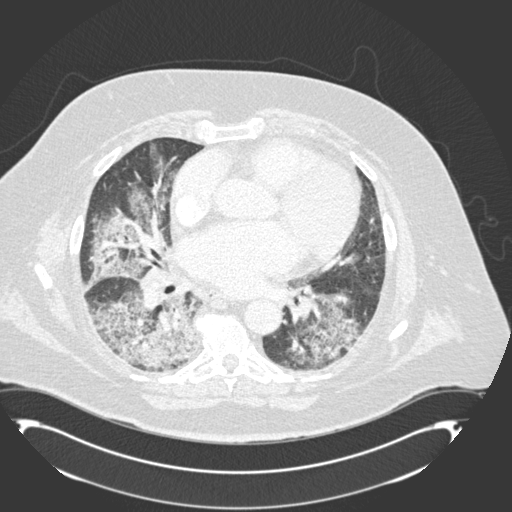
[im 187/300  mediastinal]
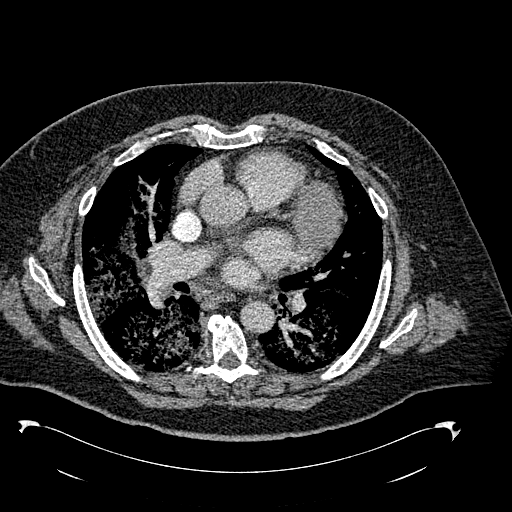
[im 206/300  lung]
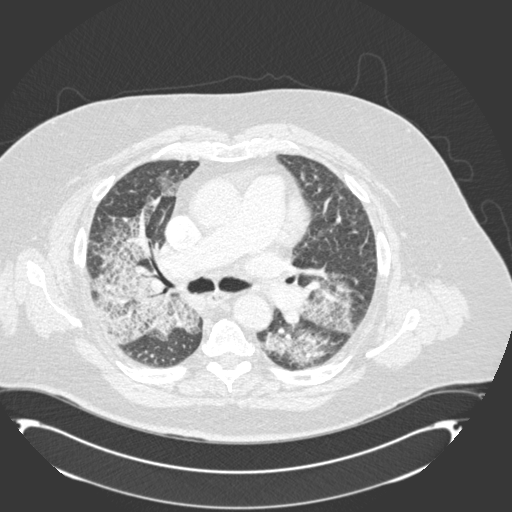
[im 225/300  mediastinal]
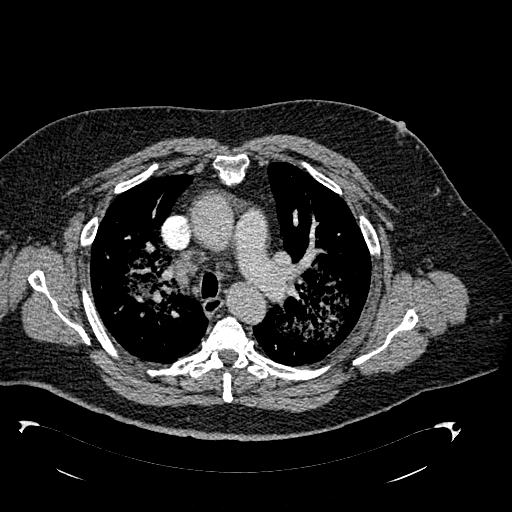
[im 243/300  lung]
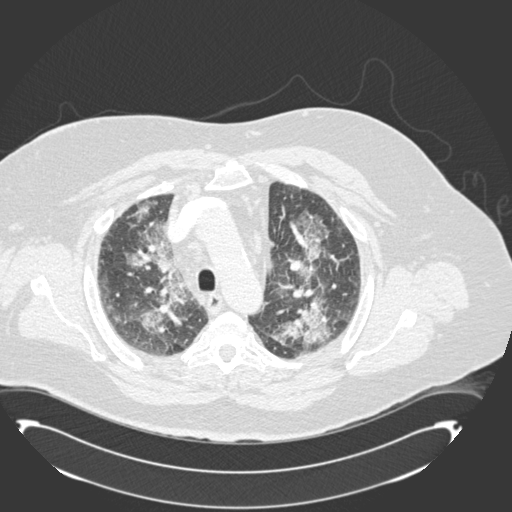
[im 262/300  mediastinal]
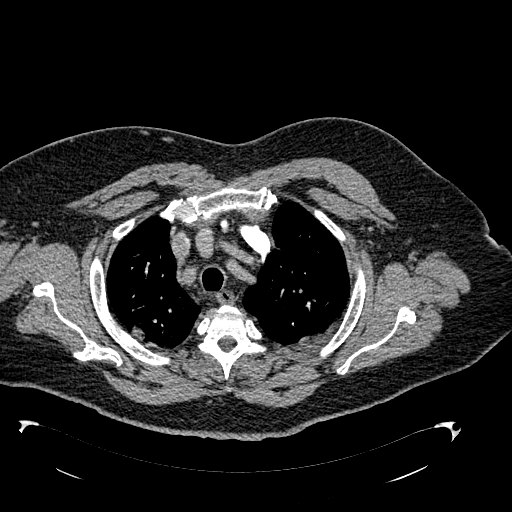
[im 281/300  lung]
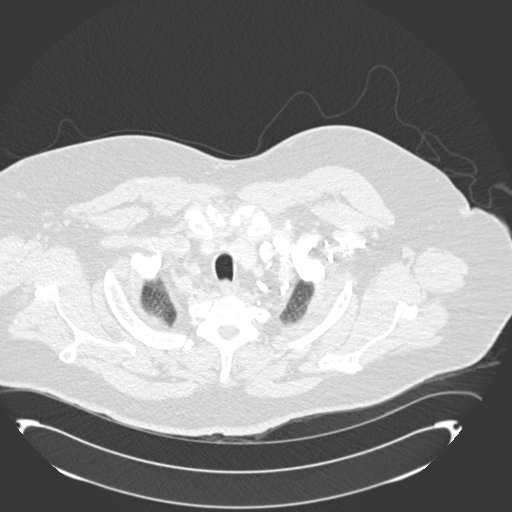

[Series 602: cor mpr · coronal · 0.89mm/px · 1 of 143 slices shown]
[im 72/143  mediastinal]
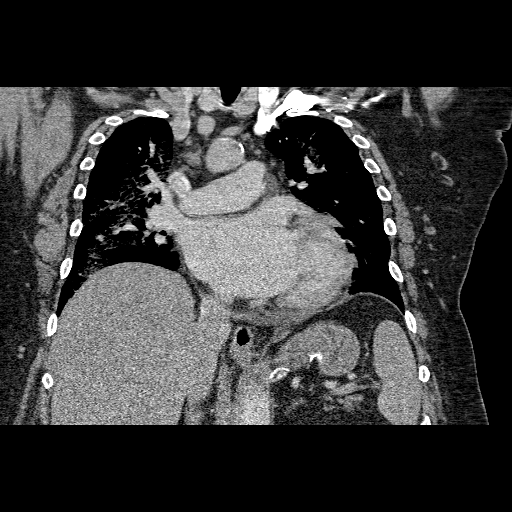

[16 of 36 positions shown; findings below may reference images not displayed]

FINDINGS: On unfortunately, the opacification of the pulmonary arteries are
not well opacified despite a second injection of IV contrast media.
However, no large central embolus is evident. The more distal
pulmonary artery branches less well visualized but no abnormality is
evident. The thoracic aorta opacifies with no significant
abnormality noted. Small mediastinal lymph nodes are unchanged in
size. Cardiomegaly is stable. No pericardial effusion is seen. The
portion of the upper abdomen that is visualized is unremarkable in
this patient has previously undergone gastric bypass surgery.

On lung window images, there patchy foci of airspace disease
diffusely involving the upper lobes, lower lobes, and right middle
lobe. These findings are nonspecific, but may represent multifocal
pneumonia with edema also a consideration. No pleural effusion is
seen however. There are degenerative changes throughout the mid to
lower thoracic spine.

Review of the MIP images confirms the above findings.
IMPRESSION: 1. No evidence of acute pulmonary embolism is seen although
opacification of the pulmonary arteries is less than optimal. No
large central embolus is seen.
2. However there is patchy airspace disease bilaterally most
consistent with multifocal pneumonia with edema less likely. No
pleural effusion is seen.
